# Patient Record
Sex: Female | Born: 1975 | Race: Black or African American | Hispanic: No | Smoking: Current every day smoker
Health system: Southern US, Community
[De-identification: ages and names within clinical notes are randomized; demographics above are authoritative.]

## PROBLEM LIST (undated history)

## (undated) DIAGNOSIS — L309 Dermatitis, unspecified: Secondary | ICD-10-CM

## (undated) DIAGNOSIS — S82852A Displaced trimalleolar fracture of left lower leg, initial encounter for closed fracture: Secondary | ICD-10-CM

## (undated) HISTORY — PX: TUBAL LIGATION: SHX77

---

## 2011-06-26 ENCOUNTER — Emergency Department (HOSPITAL_COMMUNITY): Payer: Medicaid Other

## 2011-06-26 ENCOUNTER — Emergency Department (HOSPITAL_COMMUNITY)
Admission: EM | Admit: 2011-06-26 | Discharge: 2011-06-26 | Disposition: A | Discharge status: Home / Self Care | Payer: Medicaid Other | Attending: Emergency Medicine | Admitting: Emergency Medicine

## 2011-06-26 DIAGNOSIS — O009 Unspecified ectopic pregnancy without intrauterine pregnancy: Secondary | ICD-10-CM | POA: Insufficient documentation

## 2011-06-26 DIAGNOSIS — O208 Other hemorrhage in early pregnancy: Secondary | ICD-10-CM | POA: Insufficient documentation

## 2011-06-26 DIAGNOSIS — R109 Unspecified abdominal pain: Secondary | ICD-10-CM | POA: Insufficient documentation

## 2011-06-26 LAB — POCT PREGNANCY, URINE: Preg Test, Ur: POSITIVE

## 2011-06-26 LAB — DIFFERENTIAL
Lymphocytes Relative: 32 % (ref 12–46)
Lymphs Abs: 2.2 10*3/uL (ref 0.7–4.0)
Neutrophils Relative %: 56 % (ref 43–77)

## 2011-06-26 LAB — URINALYSIS, ROUTINE W REFLEX MICROSCOPIC
Bilirubin Urine: NEGATIVE
Ketones, ur: NEGATIVE mg/dL
Nitrite: NEGATIVE
Specific Gravity, Urine: 1.026 (ref 1.005–1.030)
Urobilinogen, UA: 1 mg/dL (ref 0.0–1.0)

## 2011-06-26 LAB — BASIC METABOLIC PANEL
BUN: 12 mg/dL (ref 6–23)
CO2: 27 mEq/L (ref 19–32)
Chloride: 108 mEq/L (ref 96–112)
Creatinine, Ser: 1.02 mg/dL (ref 0.50–1.10)
Glucose, Bld: 82 mg/dL (ref 70–99)

## 2011-06-26 LAB — CBC
HCT: 34.3 % — ABNORMAL LOW (ref 36.0–46.0)
Hemoglobin: 11.7 g/dL — ABNORMAL LOW (ref 12.0–15.0)
MCV: 83.1 fL (ref 78.0–100.0)
RBC: 4.13 MIL/uL (ref 3.87–5.11)
WBC: 6.8 10*3/uL (ref 4.0–10.5)

## 2011-06-26 LAB — HCG, QUANTITATIVE, PREGNANCY: hCG, Beta Chain, Quant, S: 545 m[IU]/mL — ABNORMAL HIGH (ref <5)

## 2011-06-26 LAB — ABO/RH: ABO/RH(D): A POS

## 2011-06-27 ENCOUNTER — Other Ambulatory Visit: Payer: PRIVATE HEALTH INSURANCE

## 2011-06-27 DIAGNOSIS — O039 Complete or unspecified spontaneous abortion without complication: Secondary | ICD-10-CM

## 2011-06-28 ENCOUNTER — Encounter (HOSPITAL_COMMUNITY): Payer: Self-pay

## 2011-06-28 ENCOUNTER — Inpatient Hospital Stay (HOSPITAL_COMMUNITY)
Admission: AD | Admit: 2011-06-28 | Discharge: 2011-06-28 | Disposition: A | Discharge status: Home / Self Care | Payer: PRIVATE HEALTH INSURANCE | Source: Ambulatory Visit | Attending: Obstetrics & Gynecology | Admitting: Obstetrics & Gynecology

## 2011-06-28 DIAGNOSIS — O039 Complete or unspecified spontaneous abortion without complication: Secondary | ICD-10-CM | POA: Insufficient documentation

## 2011-06-28 NOTE — Progress Notes (Signed)
Patient still having bleeding as before with mild cramping here for BHCG

## 2011-06-28 NOTE — ED Provider Notes (Signed)
History     Chief Complaint  Patient presents with  . Vaginal Bleeding  . Abdominal Cramping   HPI Patient seen for follow up bhcg.  Bleeding has decreased to a light menses.  Denies abd pain, fevers, chills, nausea.  No past medical history on file.  No past surgical history on file.  No family history on file.  History  Substance Use Topics  . Smoking status: Not on file  . Smokeless tobacco: Not on file  . Alcohol Use: Not on file    Allergies: Allergies not on file  No prescriptions prior to admission    ROS Physical Exam   Blood pressure 121/78, pulse 70, temperature 98.6 F (37 C), temperature source Oral, resp. rate 16, last menstrual period 05/15/2011.  Physical Exam   BHCG 545 to 178. MAU Course  Procedures  MDM  Assessment and Plan  1.  Spontaneous abortion.  Will have pt follow up in 2 weeks to assure bhcg return to normal.  STINSON, JACOB JEHIEL 06/28/2011, 10:15 AM

## 2011-07-03 NOTE — Progress Notes (Signed)
Encounter addended by: Brooke Pace, DO on: 07/03/2011  1:37 PM<BR>     Documentation filed: Charges VN

## 2011-07-03 NOTE — Progress Notes (Signed)
Encounter addended by: Brooke Pace, DO on: 07/03/2011  1:35 PM<BR>     Documentation filed: Charges VN

## 2011-09-02 ENCOUNTER — Inpatient Hospital Stay (HOSPITAL_COMMUNITY): Payer: Medicaid Other

## 2011-09-02 ENCOUNTER — Inpatient Hospital Stay (HOSPITAL_COMMUNITY)
Admission: AD | Admit: 2011-09-02 | Discharge: 2011-09-02 | Disposition: A | Discharge status: Home Health | Payer: Medicaid Other | Source: Ambulatory Visit | Attending: Obstetrics & Gynecology | Admitting: Obstetrics & Gynecology

## 2011-09-02 ENCOUNTER — Encounter (HOSPITAL_COMMUNITY): Payer: Self-pay

## 2011-09-02 DIAGNOSIS — O99891 Other specified diseases and conditions complicating pregnancy: Secondary | ICD-10-CM | POA: Insufficient documentation

## 2011-09-02 DIAGNOSIS — R109 Unspecified abdominal pain: Secondary | ICD-10-CM

## 2011-09-02 DIAGNOSIS — O26899 Other specified pregnancy related conditions, unspecified trimester: Secondary | ICD-10-CM

## 2011-09-02 HISTORY — DX: Dermatitis, unspecified: L30.9

## 2011-09-02 LAB — URINALYSIS, ROUTINE W REFLEX MICROSCOPIC
Glucose, UA: NEGATIVE mg/dL
Hgb urine dipstick: NEGATIVE
Specific Gravity, Urine: 1.02 (ref 1.005–1.030)
Urobilinogen, UA: 0.2 mg/dL (ref 0.0–1.0)

## 2011-09-02 LAB — WET PREP, GENITAL
Clue Cells Wet Prep HPF POC: NONE SEEN
Trich, Wet Prep: NONE SEEN
Yeast Wet Prep HPF POC: NONE SEEN

## 2011-09-02 LAB — DIFFERENTIAL
Basophils Absolute: 0 10*3/uL (ref 0.0–0.1)
Lymphocytes Relative: 37 % (ref 12–46)
Monocytes Absolute: 0.3 10*3/uL (ref 0.1–1.0)
Neutro Abs: 2.2 10*3/uL (ref 1.7–7.7)

## 2011-09-02 LAB — POCT PREGNANCY, URINE: Preg Test, Ur: POSITIVE

## 2011-09-02 LAB — CBC
HCT: 36.2 % (ref 36.0–46.0)
RDW: 13.9 % (ref 11.5–15.5)
WBC: 4.5 10*3/uL (ref 4.0–10.5)

## 2011-09-02 NOTE — Progress Notes (Signed)
Patient states she had a positive home pregnancy test last week. Has had a SAB in September. Started having lower abdominal pain, no bleeding or vaginal discharge.

## 2011-09-02 NOTE — ED Provider Notes (Signed)
History     Chief Complaint  Patient presents with  . Abdominal Pain   HPI Shannon Vaughan is 35 y.o. [id number] [redacted]w[redacted]d weeks presenting with complaints of lower left abdominal pain in early pregnancy.  +UPT X 2 at home.  Not using contraception.  She is concerned that this is a miscarriage because she had one in September.  Denies vaginal bleeding.  Has " some " vaginal discharge without odor.  Has not decided where to get prenatal care.     Past Medical History  Diagnosis Date  . Asthma   . Eczema     Past Surgical History  Procedure Date  . Cesarean section     Family History  Problem Relation Age of Onset  . Hypertension Mother   . Diabetes Mother   . Hypertension Father   . Diabetes Father     History  Substance Use Topics  . Smoking status: Current Some Day Smoker  . Smokeless tobacco: Not on file  . Alcohol Use: Yes    Allergies:  Allergies  Allergen Reactions  . Penicillins Anaphylaxis    Prescriptions prior to admission  Medication Sig Dispense Refill  . albuterol (PROVENTIL HFA;VENTOLIN HFA) 108 (90 BASE) MCG/ACT inhaler Inhale 2 puffs into the lungs every 6 (six) hours as needed. Shortness of breath       . prenatal vitamin w/FE, FA (PRENATAL 1 + 1) 27-1 MG TABS Take 1 tablet by mouth daily.          Review of Systems  Constitutional: Negative.   HENT: Negative.   Respiratory: Negative.   Cardiovascular: Negative.   Gastrointestinal: Positive for abdominal pain (greater on the lower left.). Negative for nausea, vomiting, diarrhea and constipation.   Physical Exam   Blood pressure 113/73, pulse 79, temperature 98.8 F (37.1 C), temperature source Oral, resp. rate 16, height 5\' 6"  (1.676 m), weight 169 lb 3.2 oz (76.749 kg), last menstrual period 07/29/2011, SpO2 99.00%.  Physical Exam  Constitutional: She is oriented to person, place, and time. She appears well-developed and well-nourished. No distress.  HENT:  Head: Normocephalic.  Neck:  Normal range of motion.  Cardiovascular: Normal rate.   Respiratory: Effort normal.  GI: Soft. She exhibits no distension and no mass. There is no tenderness. There is no rebound and no guarding.  Genitourinary: Uterus is not enlarged and not tender. Cervix exhibits no motion tenderness, no discharge and no friability. Right adnexum displays no mass, no tenderness and no fullness. Left adnexum displays tenderness (mild on palpation). Left adnexum displays no mass and no fullness. No tenderness or bleeding around the vagina. No vaginal discharge found.  Neurological: She is alert and oriented to person, place, and time.  Skin: Skin is warm and dry.     Results for orders placed during the hospital encounter of 09/02/11 (from the past 24 hour(s))  URINALYSIS, ROUTINE W REFLEX MICROSCOPIC     Status: Normal   Collection Time   09/02/11  8:14 AM      Component Value Range   Color, Urine YELLOW  YELLOW    Appearance CLEAR  CLEAR    Specific Gravity, Urine 1.020  1.005 - 1.030    pH 7.0  5.0 - 8.0    Glucose, UA NEGATIVE  NEGATIVE (mg/dL)   Hgb urine dipstick NEGATIVE  NEGATIVE    Bilirubin Urine NEGATIVE  NEGATIVE    Ketones, ur NEGATIVE  NEGATIVE (mg/dL)   Protein, ur NEGATIVE  NEGATIVE (mg/dL)  Urobilinogen, UA 0.2  0.0 - 1.0 (mg/dL)   Nitrite NEGATIVE  NEGATIVE    Leukocytes, UA NEGATIVE  NEGATIVE   POCT PREGNANCY, URINE     Status: Normal   Collection Time   09/02/11  8:41 AM      Component Value Range   Preg Test, Ur POSITIVE    CBC     Status: Abnormal   Collection Time   09/02/11  8:55 AM      Component Value Range   WBC 4.5  4.0 - 10.5 (K/uL)   RBC 4.23  3.87 - 5.11 (MIL/uL)   Hemoglobin 11.9 (*) 12.0 - 15.0 (g/dL)   HCT 16.1  09.6 - 04.5 (%)   MCV 85.6  78.0 - 100.0 (fL)   MCH 28.1  26.0 - 34.0 (pg)   MCHC 32.9  30.0 - 36.0 (g/dL)   RDW 40.9  81.1 - 91.4 (%)   Platelets 213  150 - 400 (K/uL)  DIFFERENTIAL     Status: Normal   Collection Time   09/02/11  8:55 AM       Component Value Range   Neutrophils Relative 50  43 - 77 (%)   Neutro Abs 2.2  1.7 - 7.7 (K/uL)   Lymphocytes Relative 37  12 - 46 (%)   Lymphs Abs 1.6  0.7 - 4.0 (K/uL)   Monocytes Relative 8  3 - 12 (%)   Monocytes Absolute 0.3  0.1 - 1.0 (K/uL)   Eosinophils Relative 5  0 - 5 (%)   Eosinophils Absolute 0.2  0.0 - 0.7 (K/uL)   Basophils Relative 1  0 - 1 (%)   Basophils Absolute 0.0  0.0 - 0.1 (K/uL)  HCG, QUANTITATIVE, PREGNANCY     Status: Abnormal   Collection Time   09/02/11  8:55 AM      Component Value Range   hCG, Beta Chain, Quant, S 6289 (*) <5 (mIU/mL)  WET PREP, GENITAL     Status: Abnormal   Collection Time   09/02/11  9:09 AM      Component Value Range   Yeast, Wet Prep NONE SEEN  NONE SEEN    Trich, Wet Prep NONE SEEN  NONE SEEN    Clue Cells, Wet Prep NONE SEEN  NONE SEEN    WBC, Wet Prep HPF POC FEW (*) NONE SEEN    ABO RH from previous record is A POSTIVE  ULTRSOUND REPORT:  [redacted]w[redacted]d IUGS with YS  Trace FF, No abnormality visualized.  SM CLC on right OV MAU Course  Procedures  GC/CHL culture to lab   Assessment and Plan  A:  Intrauterine Pregnancy at [redacted]w[redacted]d  P  Begin prenatal care with doctor of your choice.  Begin prenatal vitamins.   KEY,EVE M 09/02/2011, 9:15 AM   Matt Holmes, NP 09/02/11 1115

## 2011-11-30 ENCOUNTER — Other Ambulatory Visit: Payer: Self-pay

## 2011-11-30 ENCOUNTER — Emergency Department (HOSPITAL_COMMUNITY)
Admission: EM | Admit: 2011-11-30 | Discharge: 2011-11-30 | Disposition: A | Discharge status: Home / Self Care | Payer: Medicaid Other | Attending: Emergency Medicine | Admitting: Emergency Medicine

## 2011-11-30 ENCOUNTER — Encounter (HOSPITAL_COMMUNITY): Payer: Self-pay

## 2011-11-30 DIAGNOSIS — M545 Low back pain, unspecified: Secondary | ICD-10-CM | POA: Insufficient documentation

## 2011-11-30 DIAGNOSIS — F17200 Nicotine dependence, unspecified, uncomplicated: Secondary | ICD-10-CM | POA: Insufficient documentation

## 2011-11-30 DIAGNOSIS — J45909 Unspecified asthma, uncomplicated: Secondary | ICD-10-CM | POA: Insufficient documentation

## 2011-11-30 DIAGNOSIS — G8929 Other chronic pain: Secondary | ICD-10-CM | POA: Insufficient documentation

## 2011-11-30 DIAGNOSIS — R55 Syncope and collapse: Secondary | ICD-10-CM | POA: Insufficient documentation

## 2011-11-30 LAB — URINALYSIS, ROUTINE W REFLEX MICROSCOPIC
Bilirubin Urine: NEGATIVE
Hgb urine dipstick: NEGATIVE
Ketones, ur: NEGATIVE mg/dL
Nitrite: NEGATIVE
Urobilinogen, UA: 0.2 mg/dL (ref 0.0–1.0)

## 2011-11-30 LAB — CBC
HCT: 29.9 % — ABNORMAL LOW (ref 36.0–46.0)
MCH: 28.7 pg (ref 26.0–34.0)
MCV: 84.2 fL (ref 78.0–100.0)
Platelets: 181 10*3/uL (ref 150–400)
RDW: 14.5 % (ref 11.5–15.5)
WBC: 5.4 10*3/uL (ref 4.0–10.5)

## 2011-11-30 LAB — BASIC METABOLIC PANEL
CO2: 23 mEq/L (ref 19–32)
Calcium: 9.3 mg/dL (ref 8.4–10.5)
Creatinine, Ser: 0.74 mg/dL (ref 0.50–1.10)
GFR calc non Af Amer: >90 mL/min (ref >90)
Glucose, Bld: 84 mg/dL (ref 70–99)
Sodium: 137 mEq/L (ref 135–145)

## 2011-11-30 LAB — CARDIAC PANEL(CRET KIN+CKTOT+MB+TROPI)
CK, MB: 1.8 ng/mL (ref 0.3–4.0)
Troponin I: <0.3 ng/mL (ref <0.30)

## 2011-11-30 LAB — DIFFERENTIAL
Basophils Absolute: 0 10*3/uL (ref 0.0–0.1)
Eosinophils Absolute: 0.1 10*3/uL (ref 0.0–0.7)
Eosinophils Relative: 2 % (ref 0–5)
Lymphocytes Relative: 22 % (ref 12–46)
Monocytes Absolute: 0.4 10*3/uL (ref 0.1–1.0)

## 2011-11-30 LAB — URINE MICROSCOPIC-ADD ON

## 2011-11-30 MED ORDER — SODIUM CHLORIDE 0.9 % IV BOLUS (SEPSIS)
1000.0000 mL | Freq: Once | INTRAVENOUS | Status: AC
  Administered 2011-11-30: 1000 mL via INTRAVENOUS

## 2011-11-30 NOTE — Discharge Instructions (Signed)
Follow up with your OB-GYN. Drink plenty of fluids and change positions slowly. Return for worsening symptoms or any concerns    Syncope You have had a fainting (syncopal) spell. A fainting episode is a sudden, short-lived loss of consciousness. It results in complete recovery. It occurs because there has been a temporary shortage of oxygen and/or sugar (glucose) to the brain. CAUSES   Blood pressure pills and other medications that may lower blood pressure below normal. Sudden changes in posture (sudden standing).   Over-medication. Take your medications as directed.   Standing too long. This can cause blood to pool in the legs.   Seizure disorders.   Low blood sugar (hypoglycemia) of diabetes. This more commonly causes coma.   Bearing down to go to the bathroom. This can cause your blood pressure to rise suddenly. Your body compensates by making the blood pressure too low when you stop bearing down.   Hardening of the arteries where the brain temporarily does not receive enough blood.   Irregular heart beat and circulatory problems.   Fear, emotional distress, injury, sight of blood, or illness.  Your caregiver will send you home if the syncope was from non-worrisome causes (benign). Depending on your age and health, you may stay to be monitored and observed. If you return home, have someone stay with you if your caregiver feels that is desirable. It is very important to keep all follow-up referrals and appointments in order to properly manage this condition. This is a serious problem which can lead to serious illness and death if not carefully managed.  WARNING: Do not drive or operate machinery until your caregiver feels that it is safe for you to do so. SEEK IMMEDIATE MEDICAL CARE IF:   You have another fainting episode or faint while lying or sitting down. DO NOT DRIVE YOURSELF. Call 911 if no other help is available.   You have chest pain, are feeling sick to your stomach  (nausea), vomiting or abdominal pain.   You have an irregular heartbeat or one that is very fast (pulse over 120 beats per minute).   You have a loss of feeling in some part of your body or lose movement in your arms or legs.   You have difficulty with speech, confusion, severe weakness, or visual problems.   You become sweaty and/or feel light headed.  Make sure you are rechecked as instructed. Document Released: 09/30/2005 Document Revised: 06/12/2011 Document Reviewed: 05/21/2007 [facility] Patient Information 2012 [place], [place].

## 2011-11-30 NOTE — ED Provider Notes (Signed)
History     CSN: [id number]  Arrival date & time 11/30/11  0848   First MD Initiated Contact with Patient 11/30/11 0900      Chief Complaint  Patient presents with  . Loss of Consciousness    (Consider location/radiation/quality/duration/timing/severity/associated sxs/prior treatment) The history is provided by the patient.   Pt with syncopal episode at work. Fell from standing striking counter. No previous episodes. Pt states she became lightheaded before event. Pt denies HA, neck pain, N/V, abd pain, vag bleeding. Has [place] confirmed IUP at 18 weeks Past Medical History  Diagnosis Date  . Asthma   . Eczema     Past Surgical History  Procedure Date  . Cesarean section     Family History  Problem Relation Age of Onset  . Hypertension Mother   . Diabetes Mother   . Hypertension Father   . Diabetes Father     History  Substance Use Topics  . Smoking status: Current Some Day Smoker  . Smokeless tobacco: Not on file  . Alcohol Use: Yes    OB History    Grav Para Term Preterm Abortions TAB SAB Ect Mult Living   5 2 2  2 1 1   2       Review of Systems  Constitutional: Negative for fever and chills.  HENT: Negative for neck pain and neck stiffness.   Eyes: Negative for visual disturbance.  Respiratory: Negative for shortness of breath.   Cardiovascular: Negative for chest pain and leg swelling.  Gastrointestinal: Negative for nausea, vomiting and abdominal pain.  Genitourinary: Negative for vaginal bleeding, vaginal discharge, vaginal pain and pelvic pain.  Musculoskeletal: Back pain: Pt states there has been no change to her chronic low back pain.   Skin: Negative for color change, pallor, rash and wound.  Neurological: Positive for syncope and light-headedness. Negative for seizures, weakness, numbness and headaches.    Allergies  Penicillins  Home Medications   Current Outpatient Rx  Name Route Sig Dispense Refill  . ALBUTEROL SULFATE HFA 108 (90 BASE)  MCG/ACT IN AERS Inhalation Inhale 2 puffs into the lungs every 6 (six) hours as needed. Shortness of breath     . PRENATAL PLUS 27-1 MG PO TABS Oral Take 1 tablet by mouth daily.        BP 103/59  Pulse 88  Temp 97.8 F (36.6 C)  Resp 16  SpO2 100%  LMP 07/29/2011  Physical Exam  Nursing note and vitals reviewed. Constitutional: She is oriented to person, place, and time. She appears well-developed and well-nourished. No distress.  HENT:  Head: Normocephalic and atraumatic.  Mouth/Throat: Oropharynx is clear and moist.  Eyes: EOM are normal. Pupils are equal, round, and reactive to light.  Neck: Normal range of motion. Neck supple.       No cervical mid-line ttp. FROM. Cspine cleared by NEXUS criteria.   Cardiovascular: Normal rate and regular rhythm.   Pulmonary/Chest: Effort normal and breath sounds normal. No respiratory distress. She has no wheezes. She has no rales.  Abdominal: Soft. Bowel sounds are normal. She exhibits no distension. There is no tenderness. There is no rebound and no guarding.  Musculoskeletal: Normal range of motion. She exhibits no edema and no tenderness.       Very mild low lumber tenderness. No deformity  Neurological: She is alert and oriented to person, place, and time.       5/5 motor, sensation intact.   Skin: Skin is warm and dry. No  rash noted. No erythema.  Psychiatric: She has a normal mood and affect. Her behavior is normal.    ED Course  Procedures (including critical care time)  Labs Reviewed  CBC - Abnormal; Notable for the following:    RBC 3.55 (*)    Hemoglobin 10.2 (*)    HCT 29.9 (*)    All other components within normal limits  URINALYSIS, ROUTINE W REFLEX MICROSCOPIC - Abnormal; Notable for the following:    APPearance TURBID (*)    Leukocytes, UA SMALL (*)    All other components within normal limits  URINE MICROSCOPIC-ADD ON - Abnormal; Notable for the following:    Squamous Epithelial / LPF MANY (*)    Bacteria, UA MANY  (*)    All other components within normal limits  DIFFERENTIAL  BASIC METABOLIC PANEL  CARDIAC PANEL(CRET KIN+CKTOT+MB+TROPI)  URINE CULTURE   No results found.   1. Syncope      Date: 11/30/2011  Rate: 78  Rhythm: normal sinus rhythm  QRS Axis: normal  Intervals: normal  ST/T Wave abnormalities: nonspecific ST changes  Conduction Disutrbances:none  Narrative Interpretation:   Old EKG Reviewed: none available    MDM    Pt back to her baseline. Pt advised to stay well hydrated and change positions slowly. Return for any concerns        Loren Racer, MD 11/30/11 1300

## 2011-11-30 NOTE — ED Notes (Signed)
MD at bedside. EDP [place] 

## 2011-11-30 NOTE — ED Notes (Signed)
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

## 2011-11-30 NOTE — ED Notes (Signed)
Pt reports she was walking at work, felt dizzy and passed out onto counter top, pt reports when she came to she was lying flat on the floor, pt unsure if she hit her head, denies head pain, reports chronic low back pain, reports she is [redacted] wks pregnant, denies any abd pain or cramping, vaginal bleeding or d/c

## 2011-12-01 LAB — URINE CULTURE

## 2012-04-10 ENCOUNTER — Encounter (HOSPITAL_COMMUNITY): Payer: Self-pay | Admitting: Anesthesiology

## 2012-04-10 ENCOUNTER — Encounter (HOSPITAL_COMMUNITY)
Admission: AD | Disposition: A | Discharge status: Home / Self Care | Payer: Self-pay | Source: Ambulatory Visit | Attending: Obstetrics and Gynecology

## 2012-04-10 ENCOUNTER — Inpatient Hospital Stay (HOSPITAL_COMMUNITY): Payer: Medicaid Other | Admitting: Anesthesiology

## 2012-04-10 ENCOUNTER — Encounter (HOSPITAL_COMMUNITY): Payer: Self-pay | Admitting: *Deleted

## 2012-04-10 ENCOUNTER — Inpatient Hospital Stay (HOSPITAL_COMMUNITY)
Admission: AD | Admit: 2012-04-10 | Discharge: 2012-04-13 | DRG: 765 | Disposition: A | Discharge status: Home / Self Care | Payer: Medicaid Other | Source: Ambulatory Visit | Attending: Obstetrics and Gynecology | Admitting: Obstetrics and Gynecology

## 2012-04-10 DIAGNOSIS — O09529 Supervision of elderly multigravida, unspecified trimester: Secondary | ICD-10-CM | POA: Diagnosis present

## 2012-04-10 DIAGNOSIS — Z302 Encounter for sterilization: Secondary | ICD-10-CM

## 2012-04-10 DIAGNOSIS — O42919 Preterm premature rupture of membranes, unspecified as to length of time between rupture and onset of labor, unspecified trimester: Secondary | ICD-10-CM | POA: Diagnosis present

## 2012-04-10 DIAGNOSIS — O4290 Premature rupture of membranes, unspecified as to length of time between rupture and onset of labor, unspecified weeks of gestation: Secondary | ICD-10-CM | POA: Diagnosis present

## 2012-04-10 DIAGNOSIS — O34219 Maternal care for unspecified type scar from previous cesarean delivery: Principal | ICD-10-CM | POA: Diagnosis present

## 2012-04-10 LAB — CBC
HCT: 32.7 % — ABNORMAL LOW (ref 36.0–46.0)
Hemoglobin: 11 g/dL — ABNORMAL LOW (ref 12.0–15.0)
MCHC: 33.6 g/dL (ref 30.0–36.0)
RBC: 3.8 MIL/uL — ABNORMAL LOW (ref 3.87–5.11)

## 2012-04-10 SURGERY — Surgical Case
Anesthesia: Spinal | Site: Abdomen | Wound class: Clean Contaminated

## 2012-04-10 MED ORDER — LACTATED RINGERS IV SOLN
INTRAVENOUS | Status: DC
  Administered 2012-04-10 (×2): via INTRAVENOUS

## 2012-04-10 MED ORDER — PHENYLEPHRINE HCL 10 MG/ML IJ SOLN
INTRAMUSCULAR | Status: DC | PRN
  Administered 2012-04-10: 80 ug via INTRAVENOUS

## 2012-04-10 MED ORDER — MORPHINE SULFATE 0.5 MG/ML IJ SOLN
INTRAMUSCULAR | Status: AC
  Filled 2012-04-10: qty 10

## 2012-04-10 MED ORDER — FAMOTIDINE IN NACL 20-0.9 MG/50ML-% IV SOLN
20.0000 mg | Freq: Once | INTRAVENOUS | Status: AC
  Administered 2012-04-10: 20 mg via INTRAVENOUS
  Filled 2012-04-10: qty 50

## 2012-04-10 MED ORDER — FENTANYL CITRATE 0.05 MG/ML IJ SOLN
INTRAMUSCULAR | Status: AC
  Filled 2012-04-10: qty 2

## 2012-04-10 MED ORDER — CITRIC ACID-SODIUM CITRATE 334-500 MG/5ML PO SOLN
30.0000 mL | Freq: Once | ORAL | Status: AC
  Administered 2012-04-10: 30 mL via ORAL
  Filled 2012-04-10: qty 15

## 2012-04-10 MED ORDER — GENTAMICIN SULFATE 40 MG/ML IJ SOLN
INTRAVENOUS | Status: AC
  Administered 2012-04-10: 100 mL via INTRAVENOUS
  Filled 2012-04-10: qty 2.5

## 2012-04-10 SURGICAL SUPPLY — 28 items
CHLORAPREP W/TINT 26ML (MISCELLANEOUS) ×3 IMPLANT
CLOTH BEACON ORANGE TIMEOUT ST (SAFETY) ×3 IMPLANT
CONTAINER PREFILL 10% NBF 15ML (MISCELLANEOUS) IMPLANT
DRSG COVADERM 4X10 (GAUZE/BANDAGES/DRESSINGS) ×3 IMPLANT
ELECT REM PT RETURN 9FT ADLT (ELECTROSURGICAL) ×3
ELECTRODE REM PT RTRN 9FT ADLT (ELECTROSURGICAL) ×2 IMPLANT
EXTRACTOR VACUUM KIWI (MISCELLANEOUS) IMPLANT
EXTRACTOR VACUUM M CUP 4 TUBE (SUCTIONS) IMPLANT
GLOVE BIO SURGEON STRL SZ8 (GLOVE) ×3 IMPLANT
GLOVE ORTHO TXT STRL SZ7.5 (GLOVE) ×3 IMPLANT
GOWN PREVENTION PLUS LG XLONG (DISPOSABLE) ×6 IMPLANT
KIT ABG SYR 3ML LUER SLIP (SYRINGE) ×3 IMPLANT
NEEDLE HYPO 25X5/8 SAFETYGLIDE (NEEDLE) ×3 IMPLANT
NS IRRIG 1000ML POUR BTL (IV SOLUTION) ×3 IMPLANT
PACK C SECTION WH (CUSTOM PROCEDURE TRAY) ×3 IMPLANT
RTRCTR C-SECT PINK 25CM LRG (MISCELLANEOUS) ×3 IMPLANT
SLEEVE SCD COMPRESS KNEE MED (MISCELLANEOUS) ×3 IMPLANT
STAPLER VISISTAT 35W (STAPLE) ×3 IMPLANT
SUT CHROMIC 1 CTX 36 (SUTURE) ×6 IMPLANT
SUT PLAIN 0 NONE (SUTURE) ×3 IMPLANT
SUT PLAIN 2 0 XLH (SUTURE) ×3 IMPLANT
SUT VIC AB 0 CT1 27 (SUTURE) ×2
SUT VIC AB 0 CT1 27XBRD ANBCTR (SUTURE) ×4 IMPLANT
SUT VIC AB 4-0 KS 27 (SUTURE) IMPLANT
TAPE CLOTH SURG 4X10 WHT LF (GAUZE/BANDAGES/DRESSINGS) ×3 IMPLANT
TOWEL OR 17X24 6PK STRL BLUE (TOWEL DISPOSABLE) ×6 IMPLANT
TRAY FOLEY CATH 14FR (SET/KITS/TRAYS/PACK) ×3 IMPLANT
WATER STERILE IRR 1000ML POUR (IV SOLUTION) ×3 IMPLANT

## 2012-04-10 NOTE — Progress Notes (Signed)
Pt turned to R side and not L

## 2012-04-10 NOTE — H&P (Signed)
Shannon Vaughan is a 36 y.o. female, EGA 36+ weeks, presenting for evaluation of leaking fluid.  Started leaking fluid at about 1645 today.  Eval in MAU confirms ROM, cervix not significantly dilated, irregular ctx.  Previous c-section x 2, scheduled for repeat c-section and BTL on 7-16.  Prenatal care otherwise uncomplicated, see prenatal records for complete history.  Maternal Medical History:  Reason for admission: Reason for admission: rupture of membranes.  Contractions: Frequency: irregular.   Perceived severity is mild.    Fetal activity: Perceived fetal activity is normal.    Prenatal complications: Previous c-section x 2    OB History    Grav Para Term Preterm Abortions TAB SAB Ect Mult Living   5 2 2  2 1 1   2     c-section at term x 2, EAB x 1, SAB x 2  Past Medical History  Diagnosis Date  . Asthma   . Eczema    Past Surgical History  Procedure Date  . Cesarean section    Family History: family history includes Diabetes in her father and mother and Hypertension in her father and mother. Social History:  reports that she has been smoking.  She does not have any smokeless tobacco history on file. She reports that she drinks alcohol. She reports that she does not use illicit drugs.   Prenatal Transfer Tool  Maternal Diabetes: No Genetic Screening: Normal Maternal Ultrasounds/Referrals: Normal Fetal Ultrasounds or other Referrals:  None Maternal Substance Abuse:  No Significant Maternal Medications:  None Significant Maternal Lab Results:  Lab values include: Group B Strep negative Other Comments:  PROM, previous c-section x 2  Review of Systems  Respiratory: Negative.   Cardiovascular: Negative.     Dilation: Fingertip Effacement (%): 50 Station:  (high) Exam by:: Jolyn, RN Blood pressure 124/71, pulse 101, temperature 98.3 F (36.8 C), temperature source Oral, resp. rate 20, height 5\' 6"  (1.676 m), last menstrual period 07/29/2011, SpO2 98.00%. Maternal  Exam:  Uterine Assessment: Contraction strength is mild.  Contraction frequency is irregular.   Abdomen: Patient reports no abdominal tenderness. Surgical scars: low transverse.   Estimated fetal weight is 7 lbs.   Fetal presentation: vertex  Introitus: Normal vulva. Normal vagina.  Ferning test: positive.  Amniotic fluid character: clear.     Fetal Exam Fetal Monitor Review: Mode: ultrasound.   Baseline rate: 140.  Variability: moderate (6-25 bpm).   Pattern: accelerations present, variable decelerations and late decelerations.    Fetal State Assessment: Category II - tracings are indeterminate.     Physical Exam  Constitutional: She appears well-developed and well-nourished.  Cardiovascular: Normal rate, regular rhythm and normal heart sounds.   No murmur heard. Respiratory: Effort normal and breath sounds normal. No respiratory distress. She has no wheezes.  GI: Soft.       gravid    Prenatal labs: ABO, Rh: --/--/A POS (09/12 1554) Antibody:  neg Rubella:  Imm RPR:  NR  HBsAg:   neg HIV:   NR GBS:   neg GCT 103  Assessment/Plan: IUP at 36+ weeks, previous c-section x 2, with PPROM.  Wants BTL as well.  Ate sandwich 4 hrs ago, so will try to wait for 4 more hours and then do repeat c-section with BTL.  Procedure and risks discussed.  Will proceed earlier if non-reassuring FHT.   Sameer Teeple D 04/10/2012, 7:36 PM

## 2012-04-10 NOTE — Progress Notes (Signed)
Pt resting on R side.

## 2012-04-10 NOTE — Anesthesia Preprocedure Evaluation (Signed)
Anesthesia Evaluation  Patient identified by MRN, date of birth, ID band Patient awake    Reviewed: Allergy & Precautions, H&P , Patient's Chart, lab work & pertinent test results  Airway Mallampati: II TM Distance: >3 FB Neck ROM: full    Dental No notable dental hx.    Pulmonary asthma (chest clear, no inhaler use . No recent URI's) ,  breath sounds clear to auscultation  Pulmonary exam normal       Cardiovascular Exercise Tolerance: Good Rhythm:regular Rate:Normal     Neuro/Psych    GI/Hepatic   Endo/Other    Renal/GU      Musculoskeletal   Abdominal   Peds  Hematology   Anesthesia Other Findings   Reproductive/Obstetrics                           Anesthesia Physical Anesthesia Plan  ASA: II  Anesthesia Plan: Spinal   Post-op Pain Management:    Induction:   Airway Management Planned:   Additional Equipment:   Intra-op Plan:   Post-operative Plan:   Informed Consent: I have reviewed the patients History and Physical, chart, labs and discussed the procedure including the risks, benefits and alternatives for the proposed anesthesia with the patient or authorized representative who has indicated his/her understanding and acceptance.   Dental Advisory Given  Plan Discussed with: CRNA  Anesthesia Plan Comments: (Lab work confirmed with CRNA in room. Platelets okay. Discussed spinal anesthetic, and patient consents to the procedure:  included risk of possible headache,backache, failed block, allergic reaction, and nerve injury. This patient was asked if she had any questions or concerns before the procedure started. )        Anesthesia Quick Evaluation

## 2012-04-10 NOTE — MAU Note (Signed)
Dr Cristela Blue, CN, and [facility] notified of pt's admission and plan for repeat C/S at 2330.

## 2012-04-10 NOTE — Progress Notes (Signed)
FHR 140-145 after spinal anesthesia

## 2012-04-10 NOTE — Progress Notes (Signed)
Pt had turned back to semifowlers earlier and now tilted to L side.

## 2012-04-10 NOTE — Anesthesia Procedure Notes (Signed)

## 2012-04-10 NOTE — Progress Notes (Signed)
Pt up to Br 

## 2012-04-10 NOTE — MAU Note (Signed)
Patient states she started leaking clear fluid at 1645 and continues to leak a large amount of clear fluid on arrival to MAU. Patient denies any contractions. Patient is scheduled for a repeat cesarean section on 7-16.

## 2012-04-11 ENCOUNTER — Encounter (HOSPITAL_COMMUNITY): Payer: Self-pay

## 2012-04-11 LAB — RPR: RPR Ser Ql: NONREACTIVE

## 2012-04-11 MED ORDER — ONDANSETRON HCL 4 MG/2ML IJ SOLN: 4.0000 mg | Freq: Three times a day (TID) | INTRAMUSCULAR | Status: DC | PRN

## 2012-04-11 MED ORDER — OXYTOCIN 10 UNIT/ML IJ SOLN
INTRAMUSCULAR | Status: AC
  Filled 2012-04-11: qty 4

## 2012-04-11 MED ORDER — OXYTOCIN 40 UNITS IN LACTATED RINGERS INFUSION - SIMPLE MED: 62.5000 mL/h | INTRAVENOUS | Status: AC

## 2012-04-11 MED ORDER — WITCH HAZEL-GLYCERIN EX PADS: 1.0000 "application " | MEDICATED_PAD | CUTANEOUS | Status: DC | PRN

## 2012-04-11 MED ORDER — ONDANSETRON HCL 4 MG/2ML IJ SOLN
INTRAMUSCULAR | Status: DC | PRN
  Administered 2012-04-11: 4 mg via INTRAVENOUS

## 2012-04-11 MED ORDER — ONDANSETRON HCL 4 MG/2ML IJ SOLN
INTRAMUSCULAR | Status: AC
  Filled 2012-04-11: qty 2

## 2012-04-11 MED ORDER — MAGNESIUM HYDROXIDE 400 MG/5ML PO SUSP: 30.0000 mL | ORAL | Status: DC | PRN

## 2012-04-11 MED ORDER — SIMETHICONE 80 MG PO CHEW: 80.0000 mg | CHEWABLE_TABLET | ORAL | Status: DC | PRN

## 2012-04-11 MED ORDER — DIBUCAINE 1 % RE OINT: 1.0000 "application " | TOPICAL_OINTMENT | RECTAL | Status: DC | PRN

## 2012-04-11 MED ORDER — NALBUPHINE SYRINGE 5 MG/0.5 ML
INJECTION | INTRAMUSCULAR | Status: AC
  Administered 2012-04-11: 10 mg via SUBCUTANEOUS
  Filled 2012-04-11: qty 1

## 2012-04-11 MED ORDER — IBUPROFEN 600 MG PO TABS: 600.0000 mg | ORAL_TABLET | Freq: Four times a day (QID) | ORAL | Status: DC | PRN

## 2012-04-11 MED ORDER — NALBUPHINE SYRINGE 5 MG/0.5 ML
5.0000 mg | INJECTION | INTRAMUSCULAR | Status: DC | PRN
  Administered 2012-04-11 (×3): 10 mg via SUBCUTANEOUS
  Administered 2012-04-12: 5 mg via SUBCUTANEOUS
  Filled 2012-04-11: qty 1
  Filled 2012-04-11: qty 0.5

## 2012-04-11 MED ORDER — ONDANSETRON HCL 4 MG/2ML IJ SOLN: 4.0000 mg | INTRAMUSCULAR | Status: DC | PRN

## 2012-04-11 MED ORDER — NALBUPHINE SYRINGE 5 MG/0.5 ML
5.0000 mg | INJECTION | INTRAMUSCULAR | Status: DC | PRN
  Filled 2012-04-11 (×4): qty 1

## 2012-04-11 MED ORDER — TETANUS-DIPHTH-ACELL PERTUSSIS 5-2.5-18.5 LF-MCG/0.5 IM SUSP: 0.5000 mL | Freq: Once | INTRAMUSCULAR | Status: DC

## 2012-04-11 MED ORDER — PRENATAL MULTIVITAMIN CH
1.0000 | ORAL_TABLET | Freq: Every day | ORAL | Status: DC
  Administered 2012-04-11 – 2012-04-13 (×2): 1 via ORAL
  Filled 2012-04-11 (×2): qty 1

## 2012-04-11 MED ORDER — PHENYLEPHRINE 40 MCG/ML (10ML) SYRINGE FOR IV PUSH (FOR BLOOD PRESSURE SUPPORT)
PREFILLED_SYRINGE | INTRAVENOUS | Status: AC
  Filled 2012-04-11: qty 5

## 2012-04-11 MED ORDER — NALOXONE HCL 0.4 MG/ML IJ SOLN: 0.4000 mg | INTRAMUSCULAR | Status: DC | PRN

## 2012-04-11 MED ORDER — FENTANYL CITRATE 0.05 MG/ML IJ SOLN
INTRAMUSCULAR | Status: DC | PRN
  Administered 2012-04-10: 25 ug via INTRATHECAL
  Administered 2012-04-11: 75 ug via INTRAVENOUS

## 2012-04-11 MED ORDER — KETOROLAC TROMETHAMINE 60 MG/2ML IM SOLN
60.0000 mg | Freq: Once | INTRAMUSCULAR | Status: AC | PRN
  Administered 2012-04-11: 60 mg via INTRAMUSCULAR

## 2012-04-11 MED ORDER — DIPHENHYDRAMINE HCL 50 MG/ML IJ SOLN: 25.0000 mg | INTRAMUSCULAR | Status: DC | PRN

## 2012-04-11 MED ORDER — HYDROMORPHONE HCL PF 1 MG/ML IJ SOLN: 0.2500 mg | INTRAMUSCULAR | Status: DC | PRN

## 2012-04-11 MED ORDER — OXYCODONE-ACETAMINOPHEN 5-325 MG PO TABS
1.0000 | ORAL_TABLET | ORAL | Status: DC | PRN
  Administered 2012-04-11: 1 via ORAL
  Administered 2012-04-11: 2 via ORAL
  Administered 2012-04-12: 1 via ORAL
  Administered 2012-04-12: 2 via ORAL
  Administered 2012-04-13: 1 via ORAL
  Administered 2012-04-13: 2 via ORAL
  Filled 2012-04-11: qty 1
  Filled 2012-04-11 (×4): qty 2
  Filled 2012-04-11 (×2): qty 1

## 2012-04-11 MED ORDER — KETOROLAC TROMETHAMINE 30 MG/ML IJ SOLN: 30.0000 mg | Freq: Four times a day (QID) | INTRAMUSCULAR | Status: AC | PRN

## 2012-04-11 MED ORDER — MEPERIDINE HCL 25 MG/ML IJ SOLN: 6.2500 mg | INTRAMUSCULAR | Status: DC | PRN

## 2012-04-11 MED ORDER — SENNOSIDES-DOCUSATE SODIUM 8.6-50 MG PO TABS
2.0000 | ORAL_TABLET | Freq: Every day | ORAL | Status: DC
  Administered 2012-04-11 – 2012-04-12 (×2): 2 via ORAL

## 2012-04-11 MED ORDER — ZOLPIDEM TARTRATE 5 MG PO TABS: 5.0000 mg | ORAL_TABLET | Freq: Every evening | ORAL | Status: DC | PRN

## 2012-04-11 MED ORDER — MEASLES, MUMPS & RUBELLA VAC ~~LOC~~ INJ
0.5000 mL | INJECTION | Freq: Once | SUBCUTANEOUS | Status: DC
  Filled 2012-04-11: qty 0.5

## 2012-04-11 MED ORDER — SODIUM CHLORIDE 0.9 % IV SOLN
1.0000 ug/kg/h | INTRAVENOUS | Status: DC | PRN
  Filled 2012-04-11: qty 2.5

## 2012-04-11 MED ORDER — ONDANSETRON HCL 4 MG PO TABS: 4.0000 mg | ORAL_TABLET | ORAL | Status: DC | PRN

## 2012-04-11 MED ORDER — DIPHENHYDRAMINE HCL 25 MG PO CAPS
25.0000 mg | ORAL_CAPSULE | ORAL | Status: DC | PRN
  Administered 2012-04-11: 25 mg via ORAL
  Filled 2012-04-11: qty 1

## 2012-04-11 MED ORDER — ALBUTEROL SULFATE HFA 108 (90 BASE) MCG/ACT IN AERS
2.0000 | INHALATION_SPRAY | Freq: Four times a day (QID) | RESPIRATORY_TRACT | Status: DC | PRN
  Filled 2012-04-11: qty 6.7

## 2012-04-11 MED ORDER — KETOROLAC TROMETHAMINE 30 MG/ML IJ SOLN
30.0000 mg | Freq: Four times a day (QID) | INTRAMUSCULAR | Status: AC | PRN
  Filled 2012-04-11: qty 1

## 2012-04-11 MED ORDER — MENTHOL 3 MG MT LOZG: 1.0000 | LOZENGE | OROMUCOSAL | Status: DC | PRN

## 2012-04-11 MED ORDER — DIPHENHYDRAMINE HCL 50 MG/ML IJ SOLN: 12.5000 mg | INTRAMUSCULAR | Status: DC | PRN

## 2012-04-11 MED ORDER — LACTATED RINGERS IV SOLN
INTRAVENOUS | Status: DC
  Administered 2012-04-11: 13:00:00 via INTRAVENOUS

## 2012-04-11 MED ORDER — MORPHINE SULFATE (PF) 0.5 MG/ML IJ SOLN
INTRAMUSCULAR | Status: DC | PRN
  Administered 2012-04-10: .15 mg via INTRATHECAL

## 2012-04-11 MED ORDER — DIPHENHYDRAMINE HCL 25 MG PO CAPS: 25.0000 mg | ORAL_CAPSULE | Freq: Four times a day (QID) | ORAL | Status: DC | PRN

## 2012-04-11 MED ORDER — SCOPOLAMINE 1 MG/3DAYS TD PT72: 1.0000 | MEDICATED_PATCH | Freq: Once | TRANSDERMAL | Status: DC

## 2012-04-11 MED ORDER — LANOLIN HYDROUS EX OINT: 1.0000 "application " | TOPICAL_OINTMENT | CUTANEOUS | Status: DC | PRN

## 2012-04-11 MED ORDER — SODIUM CHLORIDE 0.9 % IJ SOLN: 3.0000 mL | INTRAMUSCULAR | Status: DC | PRN

## 2012-04-11 MED ORDER — KETOROLAC TROMETHAMINE 60 MG/2ML IM SOLN
INTRAMUSCULAR | Status: AC
  Administered 2012-04-11: 60 mg via INTRAMUSCULAR
  Filled 2012-04-11: qty 2

## 2012-04-11 MED ORDER — METOCLOPRAMIDE HCL 5 MG/ML IJ SOLN: 10.0000 mg | Freq: Three times a day (TID) | INTRAMUSCULAR | Status: DC | PRN

## 2012-04-11 MED ORDER — IBUPROFEN 600 MG PO TABS
600.0000 mg | ORAL_TABLET | Freq: Four times a day (QID) | ORAL | Status: DC
  Administered 2012-04-11 – 2012-04-13 (×7): 600 mg via ORAL
  Filled 2012-04-11 (×7): qty 1

## 2012-04-11 MED ORDER — SIMETHICONE 80 MG PO CHEW
80.0000 mg | CHEWABLE_TABLET | Freq: Three times a day (TID) | ORAL | Status: DC
  Administered 2012-04-11 – 2012-04-13 (×5): 80 mg via ORAL

## 2012-04-11 NOTE — Anesthesia Postprocedure Evaluation (Signed)
  Anesthesia Post-op Note  Patient: Shannon Vaughan  Procedure(s) Performed: Procedure(s) (LRB): CESAREAN SECTION WITH BILATERAL TUBAL LIGATION ()  Patient Location: Mother/Baby  Anesthesia Type: Spinal  Level of Consciousness: awake, alert  and oriented  Airway and Oxygen Therapy: Patient Spontanous Breathing  Post-op Pain: mild  Post-op Assessment: Patient's Cardiovascular Status Stable and Respiratory Function Stable  Post-op Vital Signs: stable  Complications: No apparent anesthesia complications

## 2012-04-11 NOTE — Op Note (Signed)
Preoperative diagnosis: Intrauterine pregnancy at 36 weeks, PPROM, previous c-section x 2, desires sterility Postoperative diagnosis: Same Procedure: Repeat low transverse cesarean section without extensions, bilateral partial salpingectomy Surgeon: Lavina Hamman M.D. Anesthesia: Spinal Findings: Patient had normal gravid anatomy and delivered a viable female infant with Apgars of 9 and 9 weight pending Estimated blood loss: 800 cc Specimens: Placenta sent to labor and delivery Complications: None  Procedure in detail: The patient was taken to the operating room and placed in the sitting position. Dr. Cristela Blue instilled spinal anesthesia. Abdomen was then prepped and draped in the usual sterile fashion. A Foley catheter was been placed. The level of her anesthesia was found to be adequate. Abdomen was entered via a standard Pfannenstiel incision through her previous scar. Once the peritoneal cavity was entered the Alexis disposable self-retaining retractor was placed and good visualization was achieved. A 4 cm transverse incision was then made in the lower uterine segment pushing the bladder inferior. Once the uterine cavity was entered the incision was extended digitally. The fetal vertex was grasped and delivered through the incision atraumatically. Mouth and nares were suctioned. The remainder of the infant then delivered atraumatically. Cord was doubly clamped and cut and the infant handed to the awaiting pediatric team. Cord blood was obtained. The placenta delivered spontaneously. Uterus was wiped dry with clean lap pad and all clots and debris were removed. Uterine incision was inspected and found to be free of extensions. Uterine incision with running locking  #1 chromic. 2 figure 8 sutures of #1 Chromic were placed for hemostasis.  Tubes and ovaries were inspected and found to be normal. Uterine incision was inspected and found to be hemostatic. Bleeding from serosal edges was controlled  with electrocautery.   Attention was turned to tubal ligation.  Both tubes were identified and traced to their fimbriated ends.  The middle of each tube was grasped with a Babcock clamp.  A window was made in an avascular portion of the mesosalpinx.  0 plain gut suture was passed through this window, tied proximally, then wrapped around the tube to create a knuckle of tube.  The knuckle of tube was then removed sharply.  On both sides, both tubal ostia were identified and the stumps were hemostatic.  The Alexis retractor was removed. Subfascial space was irrigated and made hemostatic with electrocautery. Fascia was closed in running fashion starting at both ends and meeting in the middle with 0 Vicryl. Subcutaneous tissue was then irrigated and made hemostatic with electrocautery and closed with running 2-0 plain gut after freeing up the inferior edge with electrocautery. Skin was closed with staples followed by a sterile dressing. Patient tolerated the procedure well and was taken to the recovery in stable condition. Counts were correct x2, she received Gentamicin and Clindamycin at the beginning of the procedure and she had PAS hose on throughout the procedure.

## 2012-04-11 NOTE — Addendum Note (Signed)
Addendum  created 04/11/12 1203 by Earmon Phoenix, CRNA   Modules edited:Notes Section

## 2012-04-11 NOTE — Anesthesia Postprocedure Evaluation (Signed)
  Anesthesia Post-op Note  Patient: Shannon Vaughan  Procedure(s) Performed: Procedure(s) (LRB): CESAREAN SECTION WITH BILATERAL TUBAL LIGATION ()   Patient is awake, responsive, moving her legs, and has signs of resolution of her numbness. Pain and nausea are reasonably well controlled. Vital signs are stable and clinically acceptable. Oxygen saturation is clinically acceptable. There are no apparent anesthetic complications at this time. Patient is ready for discharge.

## 2012-04-11 NOTE — Transfer of Care (Signed)
Immediate Anesthesia Transfer of Care Note  Patient: Shannon Vaughan  Procedure(s) Performed: Procedure(s) (LRB): CESAREAN SECTION (N/A)  Patient Location: PACU  Anesthesia Type: Spinal  Level of Consciousness: awake, alert  and oriented  Airway & Oxygen Therapy: Patient Spontanous Breathing  Post-op Assessment: Report given to PACU RN and Post -op Vital signs reviewed and stable  Post vital signs: Reviewed and stable  Complications: No apparent anesthesia complications

## 2012-04-12 LAB — CBC
MCH: 28.6 pg (ref 26.0–34.0)
MCHC: 32.9 g/dL (ref 30.0–36.0)
MCV: 86.9 fL (ref 78.0–100.0)
Platelets: 180 10*3/uL (ref 150–400)
RBC: 3.43 MIL/uL — ABNORMAL LOW (ref 3.87–5.11)

## 2012-04-12 NOTE — Progress Notes (Signed)
Subjective: Postpartum Day #1: Cesarean Delivery Patient reports incisional pain, tolerating PO and no problems voiding.    Objective: Vital signs in last 24 hours: Temp:  [97.6 F (36.4 C)-98.1 F (36.7 C)] 98.1 F (36.7 C) (06/30 0700) Pulse Rate:  [69-78] 75  (06/30 0700) Resp:  [20] 20  (06/30 0700) BP: (102-123)/(66-72) 108/72 mmHg (06/30 0700) SpO2:  [96 %-97 %] 96 % (06/29 1647)  Physical Exam:  General: alert Lochia: appropriate Uterine Fundus: firm Incision: dressing C/D/I   Basename 04/12/12 0540 04/10/12 1925  HGB 9.8* 11.0*  HCT 29.8* 32.7*    Assessment/Plan: Status post Cesarean section. Doing well postoperatively.  Continue current care, ambulate.  Luca Burston D 04/12/2012, 10:42 AM

## 2012-04-13 MED ORDER — OXYCODONE-ACETAMINOPHEN 5-325 MG PO TABS: 1.0000 | ORAL_TABLET | ORAL | Status: AC | PRN

## 2012-04-13 MED ORDER — IBUPROFEN 600 MG PO TABS: 600.0000 mg | ORAL_TABLET | Freq: Four times a day (QID) | ORAL | Status: AC

## 2012-04-13 NOTE — Progress Notes (Signed)
POD #2 repeat LTCS No problems, wants to go home Afeb, VSS Abd- soft, incision intact D/c home

## 2012-04-13 NOTE — Progress Notes (Signed)
Post discharge review completed. 

## 2012-04-13 NOTE — Discharge Instructions (Signed)
As per discharge pamphlet °

## 2012-04-13 NOTE — Discharge Summary (Signed)
Obstetric Discharge Summary Reason for Admission: rupture of membranes Prenatal Procedures: none Intrapartum Procedures: cesarean: low cervical, transverse and tubal ligation Postpartum Procedures: none Complications-Operative and Postpartum: none Hemoglobin  Date Value Range Status  04/12/2012 9.8* 12.0 - 15.0 g/dL Final     HCT  Date Value Range Status  04/12/2012 29.8* 36.0 - 46.0 % Final    Physical Exam:  General: alert Lochia: appropriate Uterine Fundus: firm Incision: healing well  Discharge Diagnoses: IUP at 36+ weeks, previous cesarean section, PPROM, desires surgical sterility  Discharge Information: Date: 04/13/2012 Activity: pelvic rest and no strenuous activity Diet: routine Medications: Ibuprofen and Percocet Condition: stable Instructions: refer to practice specific booklet Discharge to: home Follow-up Information    Follow up with Rodell Perna., NP. Schedule an appointment as soon as possible for a visit in 4 days. (for staple removal)    Contact information:   [address] [place] [address] [phone]          Newborn Data: Live born female  Birth Weight: 5 lb 14 oz (2665 g) APGAR: 8, 9  Home with mother.  Dequavius Kuhner D 04/13/2012, 8:46 AM

## 2012-04-28 ENCOUNTER — Inpatient Hospital Stay (HOSPITAL_COMMUNITY)
Admission: RE | Admit: 2012-04-28 | Payer: Medicaid Other | Source: Ambulatory Visit | Admitting: Obstetrics and Gynecology

## 2012-04-28 ENCOUNTER — Encounter (HOSPITAL_COMMUNITY): Admission: RE | Payer: Self-pay | Source: Ambulatory Visit

## 2012-04-28 SURGERY — Surgical Case
Anesthesia: Regional | Laterality: Bilateral

## 2014-04-26 ENCOUNTER — Encounter (HOSPITAL_COMMUNITY): Payer: Self-pay | Admitting: Emergency Medicine

## 2014-04-26 ENCOUNTER — Emergency Department (HOSPITAL_COMMUNITY): Payer: 59

## 2014-04-26 ENCOUNTER — Emergency Department (HOSPITAL_COMMUNITY)
Admission: EM | Admit: 2014-04-26 | Discharge: 2014-04-26 | Disposition: A | Discharge status: Home / Self Care | Payer: 59 | Attending: Emergency Medicine | Admitting: Emergency Medicine

## 2014-04-26 DIAGNOSIS — F17200 Nicotine dependence, unspecified, uncomplicated: Secondary | ICD-10-CM | POA: Insufficient documentation

## 2014-04-26 DIAGNOSIS — IMO0002 Reserved for concepts with insufficient information to code with codable children: Secondary | ICD-10-CM | POA: Insufficient documentation

## 2014-04-26 DIAGNOSIS — Z880 Allergy status to penicillin: Secondary | ICD-10-CM | POA: Insufficient documentation

## 2014-04-26 DIAGNOSIS — M62830 Muscle spasm of back: Secondary | ICD-10-CM

## 2014-04-26 DIAGNOSIS — J45909 Unspecified asthma, uncomplicated: Secondary | ICD-10-CM | POA: Insufficient documentation

## 2014-04-26 DIAGNOSIS — Z872 Personal history of diseases of the skin and subcutaneous tissue: Secondary | ICD-10-CM | POA: Insufficient documentation

## 2014-04-26 DIAGNOSIS — Z79899 Other long term (current) drug therapy: Secondary | ICD-10-CM | POA: Insufficient documentation

## 2014-04-26 MED ORDER — METHOCARBAMOL 500 MG PO TABS: 500.0000 mg | ORAL_TABLET | Freq: Two times a day (BID) | ORAL | Status: DC

## 2014-04-26 MED ORDER — LORAZEPAM 2 MG/ML IJ SOLN
0.5000 mg | Freq: Once | INTRAMUSCULAR | Status: AC
  Administered 2014-04-26: 0.5 mg via INTRAVENOUS
  Filled 2014-04-26: qty 1

## 2014-04-26 MED ORDER — HYDROCODONE-ACETAMINOPHEN 5-325 MG PO TABS: 2.0000 | ORAL_TABLET | ORAL | Status: DC | PRN

## 2014-04-26 MED ORDER — MORPHINE SULFATE 4 MG/ML IJ SOLN
4.0000 mg | Freq: Once | INTRAMUSCULAR | Status: AC
  Administered 2014-04-26: 4 mg via INTRAVENOUS
  Filled 2014-04-26: qty 1

## 2014-04-26 NOTE — Discharge Instructions (Signed)
Use ibuprofen as directed. Use the muscle relaxant and pain medication only at night when you are at home  Muscle Strain A muscle strain is an injury that occurs when a muscle is stretched beyond its normal length. Usually a small number of muscle fibers are torn when this happens. Muscle strain is rated in degrees. First-degree strains have the least amount of muscle fiber tearing and pain. Second-degree and third-degree strains have increasingly more tearing and pain.  Usually, recovery from muscle strain takes 1-2 weeks. Complete healing takes 5-6 weeks.  CAUSES  Muscle strain happens when a sudden, violent force placed on a muscle stretches it too far. This may occur with lifting, sports, or a fall.  RISK FACTORS Muscle strain is especially common in athletes.  SIGNS AND SYMPTOMS At the site of the muscle strain, there may be:  Pain.  Bruising.  Swelling.  Difficulty using the muscle due to pain or lack of normal function. DIAGNOSIS  Your health care provider will perform a physical exam and ask about your medical history. TREATMENT  Often, the best treatment for a muscle strain is resting, icing, and applying cold compresses to the injured area.  HOME CARE INSTRUCTIONS   Use the PRICE method of treatment to promote muscle healing during the first 2-3 days after your injury. The PRICE method involves:  Protecting the muscle from being injured again.  Restricting your activity and resting the injured body part.  Icing your injury. To do this, put ice in a plastic bag. Place a towel between your skin and the bag. Then, apply the ice and leave it on from 15-20 minutes each hour. After the third day, switch to moist heat packs.  Apply compression to the injured area with a splint or elastic bandage. Be careful not to wrap it too tightly. This may interfere with blood circulation or increase swelling.  Elevate the injured body part above the level of your heart as often as you  can.  Only take over-the-counter or prescription medicines for pain, discomfort, or fever as directed by your health care provider.  Warming up prior to exercise helps to prevent future muscle strains. SEEK MEDICAL CARE IF:   You have increasing pain or swelling in the injured area.  You have numbness, tingling, or a significant loss of strength in the injured area. MAKE SURE YOU:   Understand these instructions.  Will watch your condition.  Will get help right away if you are not doing well or get worse. Document Released: 09/30/2005 Document Revised: 07/21/2013 Document Reviewed: 04/29/2013 [facility] Patient Information 2015 [place], [place]. This information is not intended to replace advice given to you by your health care provider. Make sure you discuss any questions you have with your health care provider.

## 2014-04-26 NOTE — ED Notes (Addendum)
Pt from home. Called EMS for R side chest pain radiating to back that began at 0630 this am. Pain worse with movements. Denies sob or dizziness. Pain decreased after EMS gave 30mg  tordol.

## 2014-04-26 NOTE — ED Provider Notes (Signed)
CSN: [id number]     Arrival date & time 04/26/14  7846 History   First MD Initiated Contact with Patient 04/26/14 [phone]     Chief Complaint  Patient presents with  . Chest Pain     (Consider location/radiation/quality/duration/timing/severity/associated sxs/prior Treatment) The history is provided by the patient.   Patient here complaining of right-sided scapular pain radiating to her right chest. Symptoms began suddenly this morning and worse with movement. Denies any anginal type chest pain. No dyspnea. Symptoms are better with remaining still and worse with any kind of movement. Denies any new injury. No syncope or near-syncope. EMS was called and gave patient Toradol and she feels much better at this time. Past Medical History  Diagnosis Date  . Asthma   . Eczema    Past Surgical History  Procedure Laterality Date  . Cesarean section     Family History  Problem Relation Age of Onset  . Hypertension Mother   . Diabetes Mother   . Hypertension Father   . Diabetes Father   . Other Neg Hx    History  Substance Use Topics  . Smoking status: Current Some Day Smoker -- 0.25 packs/day  . Smokeless tobacco: Not on file  . Alcohol Use: Yes   OB History   Grav Para Term Preterm Abortions TAB SAB Ect Mult Living   5 3 2 1 2 1 1   3      Review of Systems  All other systems reviewed and are negative.     Allergies  Penicillins and Aspirin  Home Medications   Prior to Admission medications   Medication Sig Start Date End Date Taking? Authorizing Provider  albuterol (PROVENTIL HFA;VENTOLIN HFA) 108 (90 BASE) MCG/ACT inhaler Inhale 2 puffs into the lungs every 6 (six) hours as needed. Shortness of breath     Historical Provider, MD  prenatal vitamin w/FE, FA (PRENATAL 1 + 1) 27-1 MG TABS Take 1 tablet by mouth daily.      Historical Provider, MD   BP 111/68  Pulse 62  Temp(Src) 97.8 F (36.6 C) (Oral)  Resp 16  SpO2 96% Physical Exam  Nursing note and vitals  reviewed. Constitutional: She is oriented to person, place, and time. She appears well-developed and well-nourished.  Non-toxic appearance. No distress.  HENT:  Head: Normocephalic and atraumatic.  Eyes: Conjunctivae, EOM and lids are normal. Pupils are equal, round, and reactive to light.  Neck: Normal range of motion. Neck supple. No tracheal deviation present. No mass present.  Cardiovascular: Normal rate, regular rhythm and normal heart sounds.  Exam reveals no gallop.   No murmur heard. Pulmonary/Chest: Effort normal and breath sounds normal. No stridor. No respiratory distress. She has no decreased breath sounds. She has no wheezes. She has no rhonchi. She has no rales.  Abdominal: Soft. Normal appearance and bowel sounds are normal. She exhibits no distension. There is no tenderness. There is no rebound and no CVA tenderness.  Musculoskeletal: Normal range of motion. She exhibits no edema and no tenderness.       Right shoulder: She exhibits pain and spasm.       Arms: Neurological: She is alert and oriented to person, place, and time. She has normal strength. No cranial nerve deficit or sensory deficit. GCS eye subscore is 4. GCS verbal subscore is 5. GCS motor subscore is 6.  Skin: Skin is warm and dry. No abrasion and no rash noted.  Psychiatric: She has a normal mood and affect. Her  speech is normal and behavior is normal.    ED Course  Procedures (including critical care time) Labs Review Labs Reviewed - No data to display  Imaging Review No results found.   EKG Interpretation   Date/Time:  Tuesday April 26 2014 07:47:54 EDT Ventricular Rate:  66 PR Interval:  145 QRS Duration: 74 QT Interval:  391 QTC Calculation: 410 R Axis:   39 Text Interpretation:  Sinus rhythm Confirmed by Benson Porcaro  MD, Sindee Stucker ([id number])  on 04/26/2014 8:06:04 AM      MDM   Final diagnoses:  None   Pt given meds here feels better. Muscle tenderness has greatly improved. She relates being under  a great deal of stress and did ask me this could cause her symptoms. No concern for ACS or PE. Chest x-ray was negative per radiology. Pulse oximetry stable.    Toy BakerAnthony T Nalu Troublefield, MD 04/26/14 [phone]

## 2014-04-26 NOTE — ED Notes (Signed)
Bed: [id number] Expected date:  Expected time:  Means of arrival:  Comments: EMS-chest pressure-relieved with toradol

## 2014-08-15 ENCOUNTER — Encounter (HOSPITAL_COMMUNITY): Payer: Self-pay | Admitting: Emergency Medicine

## 2015-05-23 ENCOUNTER — Ambulatory Visit (INDEPENDENT_AMBULATORY_CARE_PROVIDER_SITE_OTHER): Payer: PRIVATE HEALTH INSURANCE | Admitting: Family Medicine

## 2015-05-23 ENCOUNTER — Encounter: Payer: Self-pay | Admitting: Family Medicine

## 2015-05-23 ENCOUNTER — Other Ambulatory Visit (HOSPITAL_COMMUNITY)
Admission: RE | Admit: 2015-05-23 | Discharge: 2015-05-23 | Disposition: A | Discharge status: Home / Self Care | Payer: PRIVATE HEALTH INSURANCE | Source: Ambulatory Visit | Attending: Family Medicine | Admitting: Family Medicine

## 2015-05-23 VITALS — BP 118/64 | HR 72 | Ht 67.0 in | Wt 207.8 lb

## 2015-05-23 DIAGNOSIS — Z0000 Encounter for general adult medical examination without abnormal findings: Secondary | ICD-10-CM

## 2015-05-23 DIAGNOSIS — Z1151 Encounter for screening for human papillomavirus (HPV): Secondary | ICD-10-CM | POA: Diagnosis present

## 2015-05-23 DIAGNOSIS — L309 Dermatitis, unspecified: Secondary | ICD-10-CM | POA: Diagnosis not present

## 2015-05-23 DIAGNOSIS — J4540 Moderate persistent asthma, uncomplicated: Secondary | ICD-10-CM | POA: Diagnosis not present

## 2015-05-23 DIAGNOSIS — Z008 Encounter for other general examination: Secondary | ICD-10-CM | POA: Diagnosis not present

## 2015-05-23 DIAGNOSIS — Z720 Tobacco use: Secondary | ICD-10-CM

## 2015-05-23 DIAGNOSIS — Z113 Encounter for screening for infections with a predominantly sexual mode of transmission: Secondary | ICD-10-CM

## 2015-05-23 DIAGNOSIS — B3731 Acute candidiasis of vulva and vagina: Secondary | ICD-10-CM

## 2015-05-23 DIAGNOSIS — J45909 Unspecified asthma, uncomplicated: Secondary | ICD-10-CM | POA: Insufficient documentation

## 2015-05-23 DIAGNOSIS — B373 Candidiasis of vulva and vagina: Secondary | ICD-10-CM

## 2015-05-23 DIAGNOSIS — J302 Other seasonal allergic rhinitis: Secondary | ICD-10-CM

## 2015-05-23 DIAGNOSIS — Z01419 Encounter for gynecological examination (general) (routine) without abnormal findings: Secondary | ICD-10-CM | POA: Diagnosis not present

## 2015-05-23 DIAGNOSIS — R252 Cramp and spasm: Secondary | ICD-10-CM

## 2015-05-23 DIAGNOSIS — F17200 Nicotine dependence, unspecified, uncomplicated: Secondary | ICD-10-CM | POA: Insufficient documentation

## 2015-05-23 LAB — COMPREHENSIVE METABOLIC PANEL
ALBUMIN: 4.1 g/dL (ref 3.6–5.1)
ALK PHOS: 67 U/L (ref 33–115)
ALT: 10 U/L (ref 6–29)
AST: 19 U/L (ref 10–30)
BILIRUBIN TOTAL: 0.3 mg/dL (ref 0.2–1.2)
BUN: 15 mg/dL (ref 7–25)
CHLORIDE: 103 mmol/L (ref 98–110)
CO2: 27 mmol/L (ref 20–31)
CREATININE: 1.05 mg/dL (ref 0.50–1.10)
Calcium: 9.3 mg/dL (ref 8.6–10.2)
GLUCOSE: 70 mg/dL (ref 65–99)
Potassium: 4.6 mmol/L (ref 3.5–5.3)
Sodium: 140 mmol/L (ref 135–146)
Total Protein: 7.3 g/dL (ref 6.1–8.1)

## 2015-05-23 LAB — POCT URINALYSIS DIPSTICK
BILIRUBIN UA: NEGATIVE
Blood, UA: NEGATIVE
Glucose, UA: NEGATIVE
Ketones, UA: NEGATIVE
Leukocytes, UA: NEGATIVE
Nitrite, UA: NEGATIVE
PH UA: 6
Protein, UA: NEGATIVE
Spec Grav, UA: >=1.03
Urobilinogen, UA: NEGATIVE

## 2015-05-23 LAB — CBC WITH DIFFERENTIAL/PLATELET
BASOS ABS: 0.1 10*3/uL (ref 0.0–0.1)
Basophils Relative: 1 % (ref 0–1)
EOS PCT: 5 % (ref 0–5)
Eosinophils Absolute: 0.3 10*3/uL (ref 0.0–0.7)
HEMATOCRIT: 39.9 % (ref 36.0–46.0)
HEMOGLOBIN: 13 g/dL (ref 12.0–15.0)
LYMPHS ABS: 2.7 10*3/uL (ref 0.7–4.0)
Lymphocytes Relative: 42 % (ref 12–46)
MCH: 27.7 pg (ref 26.0–34.0)
MCHC: 32.6 g/dL (ref 30.0–36.0)
MCV: 84.9 fL (ref 78.0–100.0)
MONO ABS: 0.4 10*3/uL (ref 0.1–1.0)
MONOS PCT: 7 % (ref 3–12)
MPV: 9.8 fL (ref 8.6–12.4)
Neutro Abs: 2.9 10*3/uL (ref 1.7–7.7)
Neutrophils Relative %: 45 % (ref 43–77)
PLATELETS: 261 10*3/uL (ref 150–400)
RBC: 4.7 MIL/uL (ref 3.87–5.11)
RDW: 14.6 % (ref 11.5–15.5)
WBC: 6.4 10*3/uL (ref 4.0–10.5)

## 2015-05-23 MED ORDER — ALBUTEROL SULFATE HFA 108 (90 BASE) MCG/ACT IN AERS: 2.0000 | INHALATION_SPRAY | Freq: Four times a day (QID) | RESPIRATORY_TRACT | Status: DC | PRN

## 2015-05-23 MED ORDER — MONTELUKAST SODIUM 10 MG PO TABS: 10.0000 mg | ORAL_TABLET | Freq: Every day | ORAL | Status: DC

## 2015-05-23 MED ORDER — FLUCONAZOLE 150 MG PO TABS: 150.0000 mg | ORAL_TABLET | Freq: Once | ORAL | Status: DC

## 2015-05-23 MED ORDER — TRIAMCINOLONE ACETONIDE 0.1 % EX CREA: 1.0000 "application " | TOPICAL_CREAM | Freq: Two times a day (BID) | CUTANEOUS | Status: DC

## 2015-05-23 MED ORDER — MOMETASONE FUROATE 220 MCG/INH IN AEPB: 2.0000 | INHALATION_SPRAY | Freq: Every day | RESPIRATORY_TRACT | Status: DC

## 2015-05-23 NOTE — Patient Instructions (Signed)
Hydrate and do lower leg stretches I demonstrated in the office before bedtime for leg cramps.

## 2015-05-23 NOTE — Progress Notes (Signed)
Subjective:    Patient ID: Shannon Vaughan, female    DOB: [birth year], 39 y.o.   MRN: [id number]  HPI Pt is new to the practice, here for complete physical exam and has multiple issues. She has asthma and is currently only being treated with albuterol inhaler. She states she is needing her rescue inhaler 2-3 times per day and almost every morning upon awakening. She has had asthma since childhood and states it seems to be worse since her having her 3rd child 2 years ago. Her last asthma exacerbation was 2-3 months ago. She has been hospitalized in the past for her asthma and intubated. She has a peak flow meter at home but has not used it. Her triggers seem to be allergy related and weather related.  She also has eczema and states it only affects her hands and feet. She has been using her children's triamcinolone cream for her eczema. She has seasonal allergies.  She also complains of legs cramping at night and this has been ongoing and is occasional.  Would like to have a pap today and also requests STD screening. Complains of vaginal discharge, odor and irritation that has been occuring after her menses for past 2 months. Her cycles are regular and last 5-7 days. She is married and has 3 children. Works as a Engineer, materials.   She smokes cigarettes, has an occasional glass or two of wine on the weekends and denies drug use.   Reviewed past medical, social and family history, immunizations, and health maintenance.    Review of Systems   Review of Systems Constitutional: -fever, -chills, -sweats, -unexpected weight change,-fatigue ENT: -runny nose, -ear pain, -sore throat Cardiology:  -chest pain, -palpitations, -edema Respiratory: +cough, -shortness of breath, -wheezing Gastroenterology: -abdominal pain, -nausea, -vomiting, -diarrhea, -constipation Hematology: -bleeding or bruising problems Musculoskeletal: -arthralgias, +myalgias (legs cramping at night), -joint swelling, -back  pain Ophthalmology: -vision changes Urology: -dysuria, -difficulty urinating, -hematuria, -urinary frequency, -urgency Neurology: -headache, -weakness, -tingling, -numbness       Objective:   Physical Exam BP 118/64 mmHg  Pulse 72  Ht 5\' 7"  (1.702 m)  Wt 207 lb 12.8 oz (94.257 kg)  BMI 32.54 kg/m2  LMP 05/08/2015  Breastfeeding? No  General Appearance:    Alert, cooperative, no distress, appears stated age  Head:    Normocephalic, without obvious abnormality, atraumatic  Eyes:    PERRL, conjunctiva/corneas clear, EOM's intact  Ears:    Normal TM's and external ear canals  Nose:   Nares normal, mucosa normal, no drainage or sinus   tenderness  Throat:   Lips, mucosa, and tongue normal; teeth and gums normal  Neck:   Supple, no lymphadenopathy;  thyroid:  no   enlargement/tenderness/nodules  Back:    Spine nontender, no curvature, ROM normal  Lungs:     Clear to auscultation bilaterally without wheezes, rales or     ronchi; respirations unlabored  Chest Wall:    No tenderness or deformity   Heart:    Regular rate and rhythm, S1 and S2 normal, no murmur, rub   or gallop  Breast Exam:    No tenderness, masses, or nipple discharge or inversion.      No axillary lymphadenopathy  Abdomen:     Soft, mildly tender over C-section scar, nondistended, normoactive bowel sounds,    no masses, no hepatosplenomegaly  Genitalia:    Normal external genitalia without lesions.  vagina normal; cervix without lesions, or cervical motion tenderness. White thick clumpy abnormal  vaginal discharge.  Uterus and adnexa not enlarged, mildly tender, no masses.  Pap performed  Rectal:    Not performed due to age<40 and no related complaints  Extremities:   No clubbing, cyanosis or edema  Pulses:   2+ and symmetric all extremities  Skin:   Skin color, texture, turgor normal, no rashes or lesions  Lymph nodes:   Cervical, supraclavicular, and axillary nodes normal  Neurologic:   CNII-XII intact, normal  strength, sensation and gait; reflexes 2+ and symmetric throughout          Psych:   Normal mood, affect, hygiene and grooming.     Spirometry results showed mild restriction. FeV1/FVC ratio 99% Urinalysis negative.  Wet mount revealed yeast cells. Negative clue cells and whiff test.      Assessment & Plan:    Routine general medical examination at a health care facility - Plan: POCT urinalysis dipstick, Cytology - PAP [place], CBC with Differential, Comprehensive metabolic panel  Asthma, chronic, moderate persistent, uncomplicated - Plan: Spirometry with Graph, albuterol (PROVENTIL HFA;VENTOLIN HFA) 108 (90 BASE) MCG/ACT inhaler, mometasone (ASMANEX) 220 MCG/INH inhaler, montelukast (SINGULAIR) 10 MG tablet  Eczema - Plan: triamcinolone cream (KENALOG) 0.1 %  Cramp of both lower extremities  Screen for STD (sexually transmitted disease) - Plan: GC/Chlamydia Probe Amp, HIV antibody (with reflex), RPR, POCT Wet Prep [facility])  Encounter for pulmonary function testing  Smoker  Candida vaginitis - Plan: fluconazole (DIFLUCAN) 150 MG tablet  Discussed spirometry results with patient and educated her on new asthma medications. Also educated patient that she should not be needing her rescue inhaler after getting her maintenance medications on board and if she is still needing to use rescue inhaler more than 1-2 times per week, we will need to adjust medications accordingly. Patient has a peak flow meter at home and instructed her to use this when she feels like she is having an asthma flare to determine severity. Also discussed triggers and that she should stop smoking in order to improve her condition. Asmanex sample given today.  Instructed her that the triamcinolone cream for her hands and feet is not to be used daily but only during Eczema flares.  Discussed hydrating and stretching lower legs prior to going to bed may help to minimize nighttime leg cramping. Educated her on Pap  guidelines.  Will treat for yeast infection with Diflucan and she will let me know if this does not resolve.  Will follow up pending lab results.

## 2015-05-24 LAB — POCT WET PREP (WET MOUNT)
Clue Cells Wet Prep Whiff POC: NEGATIVE
KOH Wet Prep POC: POSITIVE
Trichomonas Wet Prep HPF POC: NEGATIVE

## 2015-05-24 LAB — GC/CHLAMYDIA PROBE AMP
CT Probe RNA: NEGATIVE
GC Probe RNA: NEGATIVE

## 2015-05-24 LAB — RPR

## 2015-05-24 LAB — HIV ANTIBODY (ROUTINE TESTING W REFLEX): HIV: NONREACTIVE

## 2015-05-29 LAB — CYTOLOGY - PAP

## 2015-06-06 ENCOUNTER — Ambulatory Visit (INDEPENDENT_AMBULATORY_CARE_PROVIDER_SITE_OTHER): Payer: PRIVATE HEALTH INSURANCE | Admitting: Family Medicine

## 2015-06-06 ENCOUNTER — Encounter: Payer: Self-pay | Admitting: Family Medicine

## 2015-06-06 VITALS — BP 120/82 | HR 70 | Ht 67.0 in | Wt 211.0 lb

## 2015-06-06 DIAGNOSIS — J4530 Mild persistent asthma, uncomplicated: Secondary | ICD-10-CM

## 2015-06-06 DIAGNOSIS — Z720 Tobacco use: Secondary | ICD-10-CM | POA: Diagnosis not present

## 2015-06-06 DIAGNOSIS — F17200 Nicotine dependence, unspecified, uncomplicated: Secondary | ICD-10-CM

## 2015-06-06 DIAGNOSIS — E669 Obesity, unspecified: Secondary | ICD-10-CM | POA: Insufficient documentation

## 2015-06-06 DIAGNOSIS — J302 Other seasonal allergic rhinitis: Secondary | ICD-10-CM | POA: Diagnosis not present

## 2015-06-06 NOTE — Patient Instructions (Signed)
Asthma maintenance - use your Asmanex inhaler twice daily for 2 weeks and see if this helps you feel more comfortable Use you peak flow meter as discussed. Continue your singulair. Let me know if you are having to use the albuterol inhaler more than one time per week other than exercise. Use your albuterol inhaler 30 minutes before you exercise. Let me know if you are having difficulty after exercise.    Walking a fast pace for 30 minutes is good. Aim for 1 mile in 30 minutes and if that is relatively easy aim for 1 1/2 miles in 30 mins. My [employer] is free app for food. Nike run app is free also and will help you with exercise.    DASH Eating Plan DASH stands for "Dietary Approaches to Stop Hypertension." The DASH eating plan is a healthy eating plan that has been shown to reduce high blood pressure (hypertension). Additional health benefits may include reducing the risk of type 2 diabetes mellitus, heart disease, and stroke. The DASH eating plan may also help with weight loss. WHAT DO I NEED TO KNOW ABOUT THE DASH EATING PLAN? For the DASH eating plan, you will follow these general guidelines:  Choose foods with a percent daily value for sodium of less than 5% (as listed on the food label).  Use salt-free seasonings or herbs instead of table salt or sea salt.  Check with your health care provider or pharmacist before using salt substitutes.  Eat lower-sodium products, often labeled as "lower sodium" or "no salt added."  Eat fresh foods.  Eat more vegetables, fruits, and low-fat dairy products.  Choose whole grains. Look for the word "whole" as the first word in the ingredient list.  Choose fish and skinless chicken or [place] more often than red meat. Limit fish, poultry, and meat to 6 oz (170 g) each day.  Limit sweets, desserts, sugars, and sugary drinks.  Choose heart-healthy fats.  Limit cheese to 1 oz (28 g) per day.  Eat more home-cooked food and less restaurant, buffet,  and fast food.  Limit fried foods.  Cook foods using methods other than frying.  Limit canned vegetables. If you do use them, rinse them well to decrease the sodium.  When eating at a restaurant, ask that your food be prepared with less salt, or no salt if possible. WHAT FOODS CAN I EAT? Seek help from a dietitian for individual calorie needs. Grains Whole grain or whole wheat bread. Brown rice. Whole grain or whole wheat pasta. Quinoa, bulgur, and whole grain cereals. Low-sodium cereals. Corn or whole wheat flour tortillas. Whole grain cornbread. Whole grain crackers. Low-sodium crackers. Vegetables Fresh or frozen vegetables (raw, steamed, roasted, or grilled). Low-sodium or reduced-sodium tomato and vegetable juices. Low-sodium or reduced-sodium tomato sauce and paste. Low-sodium or reduced-sodium canned vegetables.  Fruits All fresh, canned (in natural juice), or frozen fruits. Meat and Other Protein Products Ground beef (85% or leaner), grass-fed beef, or beef trimmed of fat. Skinless chicken or [place]. Ground chicken or [place]. Pork trimmed of fat. All fish and seafood. Eggs. Dried beans, peas, or lentils. Unsalted nuts and seeds. Unsalted canned beans. Dairy Low-fat dairy products, such as skim or 1% milk, 2% or reduced-fat cheeses, low-fat ricotta or cottage cheese, or plain low-fat yogurt. Low-sodium or reduced-sodium cheeses. Fats and Oils Tub margarines without trans fats. Light or reduced-fat mayonnaise and salad dressings (reduced sodium). Avocado. Safflower, olive, or canola oils. Natural peanut or almond butter. Other Unsalted popcorn and pretzels.  The items listed above may not be a complete list of recommended foods or beverages. Contact your dietitian for more options. WHAT FOODS ARE NOT RECOMMENDED? Grains White bread. White pasta. White rice. Refined cornbread. Bagels and croissants. Crackers that contain trans fat. Vegetables Creamed or fried vegetables. Vegetables  in a cheese sauce. Regular canned vegetables. Regular canned tomato sauce and paste. Regular tomato and vegetable juices. Fruits Dried fruits. Canned fruit in light or heavy syrup. Fruit juice. Meat and Other Protein Products Fatty cuts of meat. Ribs, chicken wings, bacon, sausage, bologna, salami, chitterlings, fatback, hot dogs, bratwurst, and packaged luncheon meats. Salted nuts and seeds. Canned beans with salt. Dairy Whole or 2% milk, cream, half-and-half, and cream cheese. Whole-fat or sweetened yogurt. Full-fat cheeses or blue cheese. Nondairy creamers and whipped toppings. Processed cheese, cheese spreads, or cheese curds. Condiments Onion and garlic salt, seasoned salt, table salt, and sea salt. Canned and packaged gravies. Worcestershire sauce. Tartar sauce. Barbecue sauce. Teriyaki sauce. Soy sauce, including reduced sodium. Steak sauce. Fish sauce. Oyster sauce. Cocktail sauce. Horseradish. Ketchup and mustard. Meat flavorings and tenderizers. Bouillon cubes. Hot sauce. Tabasco sauce. Marinades. Taco seasonings. Relishes. Fats and Oils Butter, stick margarine, lard, shortening, ghee, and bacon fat. Coconut, palm kernel, or palm oils. Regular salad dressings. Other Pickles and olives. Salted popcorn and pretzels. The items listed above may not be a complete list of foods and beverages to avoid. Contact your dietitian for more information. WHERE CAN I FIND MORE INFORMATION? National Heart, Lung, and Blood Institute: [employer] Document Released: 09/19/2011 Document Revised: 02/14/2014 Document Reviewed: 08/04/2013 [facility] Patient Information 2015 [place], [place]. This information is not intended to replace advice given to you by your health care provider. Make sure you discuss any questions you have with your health care provider.

## 2015-06-06 NOTE — Progress Notes (Signed)
   Subjective:    Patient ID: Shannon Vaughan, female    DOB: 1976/05/12, 39 y.o.   MRN: [id number]  HPI She is here for a two-week follow-up for her asthma. She has been taking Singulair daily and using Asmanex intermittently and not as prescribed. States she only needed to use her albuterol inhaler twice in past week and half. She has not been using her peak flow meter and states she forgot to do this. She reports her breathing is much better. She states she is trying to lose weight by drinking water and using Lime and lemon juice. She has been skipping meals. She states exercise triggers her asthma. She continues to smoke. Overall she states she is feeling better. She denies fever, chills, cough, shortness of breath, chest pain, palpitations.    Review of Systems Pertinent positives and negatives in the history of present illness.    Objective:   Physical Exam  Constitutional: She is oriented to person, place, and time. She appears well-developed and well-nourished. No distress.  Pulmonary/Chest: Effort normal and breath sounds normal. She has no wheezes.  Neurological: She is alert and oriented to person, place, and time.  Psychiatric: She has a normal mood and affect. Her speech is normal and behavior is normal. Judgment and thought content normal. Cognition and memory are normal.          Assessment & Plan:  Asthma, chronic, mild persistent, uncomplicated  Smoker  Seasonal allergies  Obesity  Discussed that she should be using her inhaled corticosteroid twice daily and that she should continue using Singulair daily as well. She will let me know if she has to use her albuterol inhaler more than once per week except when she is using it before she exercises. Since exercises a trigger for her asthma, I recommend that she use her albuterol inhaler 30 minutes before exercising. She should not be needing to use it after she exercises and if she does she will let me know this. She is  concerned about losing weight and would like to start exercising on a regular basis. Spent at least 15 minutes counseling her on eating small frequent meals throughout the day and not skipping meals. Eating a low carbohydrate diet, low salt diet, and told her about the free app My Fitness Pal. Also encouraged her to start walking daily and gradually increase her pace. Discussed that this is a lifestyle change and not solely about weight loss. She states she knows she needs to stop smoking but is not ready to do so yet. Encouraged her to get her flu shot today however she would like to wait on this. She will follow up in 3 months or sooner if needed.

## 2015-08-29 ENCOUNTER — Ambulatory Visit: Payer: PRIVATE HEALTH INSURANCE | Admitting: Family Medicine

## 2015-09-01 ENCOUNTER — Encounter: Payer: Self-pay | Admitting: Family Medicine

## 2016-02-26 ENCOUNTER — Other Ambulatory Visit (INDEPENDENT_AMBULATORY_CARE_PROVIDER_SITE_OTHER): Payer: No Typology Code available for payment source

## 2016-02-26 DIAGNOSIS — Z111 Encounter for screening for respiratory tuberculosis: Secondary | ICD-10-CM

## 2016-02-28 LAB — TB SKIN TEST
Induration: 0 mm
TB SKIN TEST: NEGATIVE

## 2017-02-13 ENCOUNTER — Encounter: Payer: Self-pay | Admitting: Family Medicine

## 2017-02-13 ENCOUNTER — Ambulatory Visit (INDEPENDENT_AMBULATORY_CARE_PROVIDER_SITE_OTHER): Payer: No Typology Code available for payment source | Admitting: Family Medicine

## 2017-02-13 VITALS — BP 122/80 | HR 76 | Temp 98.4°F | Resp 18 | Wt 214.8 lb

## 2017-02-13 DIAGNOSIS — J4530 Mild persistent asthma, uncomplicated: Secondary | ICD-10-CM

## 2017-02-13 DIAGNOSIS — F17200 Nicotine dependence, unspecified, uncomplicated: Secondary | ICD-10-CM | POA: Diagnosis not present

## 2017-02-13 DIAGNOSIS — J301 Allergic rhinitis due to pollen: Secondary | ICD-10-CM

## 2017-02-13 DIAGNOSIS — J4540 Moderate persistent asthma, uncomplicated: Secondary | ICD-10-CM | POA: Diagnosis not present

## 2017-02-13 MED ORDER — ALBUTEROL SULFATE HFA 108 (90 BASE) MCG/ACT IN AERS: 2.0000 | INHALATION_SPRAY | Freq: Four times a day (QID) | RESPIRATORY_TRACT | 2 refills | Status: DC | PRN

## 2017-02-13 MED ORDER — FLUTICASONE PROPIONATE 50 MCG/ACT NA SUSP: 2.0000 | Freq: Every day | NASAL | 2 refills | Status: DC

## 2017-02-13 MED ORDER — ALBUTEROL SULFATE (2.5 MG/3ML) 0.083% IN NEBU: 2.5000 mg | INHALATION_SOLUTION | Freq: Four times a day (QID) | RESPIRATORY_TRACT | 1 refills | Status: DC | PRN

## 2017-02-13 MED ORDER — MOMETASONE FUROATE 220 MCG/INH IN AEPB: 2.0000 | INHALATION_SPRAY | Freq: Every day | RESPIRATORY_TRACT | 5 refills | Status: DC

## 2017-02-13 MED ORDER — MONTELUKAST SODIUM 10 MG PO TABS: 10.0000 mg | ORAL_TABLET | Freq: Every day | ORAL | 3 refills | Status: DC

## 2017-02-13 NOTE — Patient Instructions (Addendum)
Use the Asmanex inhaler daily as prescribed and start back on the singulair an oral antihistamine such as Claritin, Allegra, Xyzal, Zyrtec. Use Flonase nasal spray as well to control allergy symptoms.  Use the albuterol inhaler as needed but if you are needing it more than twice per week during the day or once a month at bedtime then let me know.   Follow up in 5-6 months for a physical and asthma and allergies.

## 2017-02-13 NOTE — Progress Notes (Signed)
Subjective: Chief Complaint  Patient presents with  . asthma    asthma and allergies. having some SOB. started with the weather changing     Shannon Vaughan is a 41 y.o. female who presents for allergies and asthma. Symptoms include nasal congestion, rhinorrhea, clogged ears, itching watery eyes, scratchy throat.  Has been having dry cough, some wheezing and tightness in her chest.  She has not been seen here since August 2016.  Denies asthma attack or flare since last visit.   States she has been needing her albuterol inhaler and/or nebulizer twice daily for the past month due to allergies flaring up. Denies fever, chills, dizziness, chest pain, palpitations, shortness of breath, DOE, orthopnea, abdominal pain, N/V/D. No LE edema.   Prior to running out of her allergy and asthma medications she reports being asymptomatic and asthma well controlled.   Long history of asthma and seasonal allergies.  Triggers are seasonal allergies and exercise.  PFTs done in 2016 and mildly abnormal.   Treatment to date: antihistamines.  Denies sick contacts.  No other aggravating or relieving factors.  No other c/o.  LMP: one week ago. Tubal ligation.  Smoking 3-4 cigarettes daily.   ROS as in subjective.   Objective: Vitals:   02/13/17 0849  BP: 122/80  Pulse: 76  Resp: 18  Temp: 98.4 F (36.9 C)    General appearance: Alert, WD/WN, no distress, well- appearing                             Skin: warm, no rash                           Head: no sinus tenderness                            Eyes: conjunctiva normal, corneas clear, PERRLA                            Ears: pearly TMs, external ear canals normal                          Nose: septum midline, turbinates swollen, with erythema and clear discharge             Mouth/throat: MMM, tongue normal, mild pharyngeal erythema                           Neck: supple, no adenopathy, no thyromegaly, nontender                          Heart: RRR,  normal S1, S2, no murmurs                         Lungs: CTA bilaterally, mild expiratory wheezes to upper lung fields, no rales, or rhonchi      Assessment: Seasonal allergic rhinitis due to pollen  Asthma, chronic, moderate persistent, uncomplicated - Plan: montelukast (SINGULAIR) 10 MG tablet, mometasone (ASMANEX) 220 MCG/INH inhaler, albuterol (PROVENTIL HFA;VENTOLIN HFA) 108 (90 Base) MCG/ACT inhaler  Mild persistent chronic asthma without complication  Smoker    Plan: Discussed management and treatment of asthma and allergies. She is not in any distress and no sign  of infectious process.  Recommend maximizing control of allergies. Start Flonase, oral antihistamine such as Claritin, allegra, Zyrtec and start back on Singulair. She will start back on daily ICS as well. Refilled medications including albuterol inhaler and nebulizer with strict instructions to return if continuing to need these on a regular basis.  Smoking cessation counseling done.  Follow up in 6 months or sooner if needed. She will be due for a CPE as well.

## 2017-08-18 ENCOUNTER — Encounter: Payer: No Typology Code available for payment source | Admitting: Family Medicine

## 2017-08-28 NOTE — Progress Notes (Signed)
Subjective:    Patient ID: Shannon Vaughan, female    DOB: [birth year], 41 y.o.   MRN: [id number]  HPI Chief Complaint  Patient presents with  . physical    fasting cpe, pap done 2016- normal, eye exam done this year with new glasses,no other concern   She is here for a complete physical exam.  States she took Phentermine for 2 weeks 2 months ago. This was given to her by her mother. States she lost weight because this helped her jump start her metabolism.   States she has made healthy dietary changes and has not been keeping up the changes or exercising since stopping the phentermine.  Requests prescription of phentermine.  No other concerns.   Last CPE: 05/2015  Other providers: eyes- Dr. Shea Evansunn  Social history: Lives with husband and 3 kids, works as a Engineer, materialssecurity officer and going to school for early childhood associate degree.  Diet: loves carbohydrates.  Excerise: nothing   Immunizations: will get flu shot at work. Tdap up to date.   Health maintenance:  Mammogram: never  Colonoscopy: never  Last Pap smear- 05/2015. Normal and neg HPV. Due in 2019 Last Menstrual cycle: 08/02/2017 Last Dental Exam: overdue  Last Eye Exam: 10/2016 and normal per patient.   Wears seatbelt always, smoke detectors in home and functioning, does not text while driving and feels safe in home environment.   Reviewed allergies, medications, past medical, surgical, family, and social history.    Review of Systems Review of Systems Constitutional: -fever, -chills, -sweats, -unexpected weight change,-fatigue ENT: -runny nose, -ear pain, -sore throat Cardiology:  -chest pain, -palpitations, -edema Respiratory: -cough, -shortness of breath, -wheezing Gastroenterology: -abdominal pain, -nausea, -vomiting, -diarrhea, -constipation  Hematology: -bleeding or bruising problems Musculoskeletal: -arthralgias, -myalgias, -joint swelling, -back pain Ophthalmology: -vision changes Urology: -dysuria,  -difficulty urinating, -hematuria, -urinary frequency, -urgency Neurology: -headache, -weakness, -tingling, -numbness       Objective:   Physical Exam BP 122/80   Pulse 81   Ht 5\' 6"  (1.676 m)   Wt 197 lb 12.8 oz (89.7 kg)   LMP 08/02/2017   BMI 31.93 kg/m   General Appearance:    Alert, cooperative, no distress, appears stated age  Head:    Normocephalic, without obvious abnormality, atraumatic  Eyes:    PERRL, conjunctiva/corneas clear, EOM's intact, fundi    benign  Ears:    Normal TM's and external ear canals  Nose:   Nares normal, mucosa normal, no drainage or sinus   tenderness  Throat:   Lips, mucosa, and tongue normal; teeth and gums normal  Neck:   Supple, no lymphadenopathy;  thyroid: mild enlargement without tenderness/nodules; no carotid   bruit or JVD  Back:    Spine nontender, no curvature, ROM normal, no CVA     tenderness  Lungs:     Clear to auscultation bilaterally without wheezes, rales or     ronchi; respirations unlabored  Chest Wall:    No tenderness or deformity   Heart:    Regular rate and rhythm, S1 and S2 normal, no murmur, rub   or gallop  Breast Exam:    No tenderness, masses, or nipple discharge or inversion.      No axillary lymphadenopathy  Abdomen:     Soft, non-tender, nondistended, normoactive bowel sounds,    no masses, no hepatosplenomegaly  Genitalia:    Declines. Pap up to date.      Extremities:   No clubbing, cyanosis or edema  Pulses:  2+ and symmetric all extremities  Skin:   Skin color, texture, turgor normal, no rashes or lesions  Lymph nodes:   Cervical, supraclavicular, and axillary nodes normal  Neurologic:   CNII-XII intact, normal strength, sensation and gait; reflexes 2+ and symmetric throughout          Psych:   Normal mood, affect, hygiene and grooming.    Urinalysis dipstick: negative      Assessment & Plan:  Routine general medical examination at a health care facility - Plan: POCT Urinalysis DIP (Proadvantage  Device), CBC with Differential/Platelet, Comprehensive metabolic panel, TSH, Lipid panel  Obesity (BMI 30-39.9) - Plan: Comprehensive metabolic panel, phentermine 37.5 MG capsule  Screening for breast cancer - Plan: MM DIGITAL SCREENING BILATERAL  Enlarged thyroid - Plan: TSH, T3, T4, Free  Smoker  Advised her to not take other people's medication. Counseled on healthy lifestyle to lose weight long term and prescription given for a 1 month course of Phentermine. Discussed possible side effects.  Thyroid enlarged today and she reports a history of enlarged thyroid. Thyroid panel ordered.  Smoking cessation counseling done.  Mammogram ordered. This is her first mammogram.  Discussed safety and health promotion.  Follow up pending labs and in 4 weeks for weight and Phentermine use.

## 2017-08-29 ENCOUNTER — Encounter: Payer: Self-pay | Admitting: Family Medicine

## 2017-08-29 ENCOUNTER — Ambulatory Visit (INDEPENDENT_AMBULATORY_CARE_PROVIDER_SITE_OTHER): Payer: No Typology Code available for payment source | Admitting: Family Medicine

## 2017-08-29 VITALS — BP 122/80 | HR 81 | Ht 66.0 in | Wt 197.8 lb

## 2017-08-29 DIAGNOSIS — E669 Obesity, unspecified: Secondary | ICD-10-CM

## 2017-08-29 DIAGNOSIS — Z1231 Encounter for screening mammogram for malignant neoplasm of breast: Secondary | ICD-10-CM | POA: Diagnosis not present

## 2017-08-29 DIAGNOSIS — E049 Nontoxic goiter, unspecified: Secondary | ICD-10-CM

## 2017-08-29 DIAGNOSIS — Z0000 Encounter for general adult medical examination without abnormal findings: Secondary | ICD-10-CM

## 2017-08-29 DIAGNOSIS — F17200 Nicotine dependence, unspecified, uncomplicated: Secondary | ICD-10-CM | POA: Diagnosis not present

## 2017-08-29 DIAGNOSIS — Z1239 Encounter for other screening for malignant neoplasm of breast: Secondary | ICD-10-CM

## 2017-08-29 LAB — TSH: TSH: 1.47 mIU/L

## 2017-08-29 LAB — CBC WITH DIFFERENTIAL/PLATELET
Basophils Absolute: 38 cells/uL (ref 0–200)
Basophils Relative: 0.9 %
Eosinophils Absolute: 160 cells/uL (ref 15–500)
Eosinophils Relative: 3.8 %
HEMATOCRIT: 35.5 % (ref 35.0–45.0)
Hemoglobin: 11.6 g/dL — ABNORMAL LOW (ref 11.7–15.5)
LYMPHS ABS: 1852 {cells}/uL (ref 850–3900)
MCH: 27.4 pg (ref 27.0–33.0)
MCHC: 32.7 g/dL (ref 32.0–36.0)
MCV: 83.7 fL (ref 80.0–100.0)
MPV: 10.3 fL (ref 7.5–12.5)
Monocytes Relative: 6.6 %
NEUTROS ABS: 1873 {cells}/uL (ref 1500–7800)
Neutrophils Relative %: 44.6 %
Platelets: 282 10*3/uL (ref 140–400)
RBC: 4.24 10*6/uL (ref 3.80–5.10)
RDW: 13 % (ref 11.0–15.0)
Total Lymphocyte: 44.1 %
WBC mixed population: 277 cells/uL (ref 200–950)
WBC: 4.2 10*3/uL (ref 3.8–10.8)

## 2017-08-29 LAB — POCT URINALYSIS DIP (PROADVANTAGE DEVICE)
BILIRUBIN UA: NEGATIVE
Blood, UA: NEGATIVE
Glucose, UA: NEGATIVE mg/dL
Ketones, POC UA: NEGATIVE mg/dL
LEUKOCYTES UA: NEGATIVE
Nitrite, UA: NEGATIVE
Protein Ur, POC: NEGATIVE mg/dL
SPECIFIC GRAVITY, URINE: 1.03
Urobilinogen, Ur: NEGATIVE
pH, UA: 6 (ref 5.0–8.0)

## 2017-08-29 LAB — LIPID PANEL
CHOL/HDL RATIO: 2.3 (calc) (ref <5.0)
Cholesterol: 117 mg/dL (ref <200)
HDL: 50 mg/dL — AB (ref >50)
LDL Cholesterol (Calc): 57 mg/dL (calc)
NON-HDL CHOLESTEROL (CALC): 67 mg/dL (ref <130)
Triglycerides: 36 mg/dL (ref <150)

## 2017-08-29 LAB — COMPREHENSIVE METABOLIC PANEL
AG Ratio: 1.5 (calc) (ref 1.0–2.5)
ALBUMIN MSPROF: 4 g/dL (ref 3.6–5.1)
ALT: 8 U/L (ref 6–29)
AST: 14 U/L (ref 10–30)
Alkaline phosphatase (APISO): 53 U/L (ref 33–115)
BILIRUBIN TOTAL: 0.5 mg/dL (ref 0.2–1.2)
BUN: 12 mg/dL (ref 7–25)
CALCIUM: 9 mg/dL (ref 8.6–10.2)
CHLORIDE: 106 mmol/L (ref 98–110)
CO2: 26 mmol/L (ref 20–32)
Creat: 1.06 mg/dL (ref 0.50–1.10)
GLUCOSE: 77 mg/dL (ref 65–99)
Globulin: 2.7 g/dL (calc) (ref 1.9–3.7)
POTASSIUM: 4.3 mmol/L (ref 3.5–5.3)
SODIUM: 138 mmol/L (ref 135–146)
Total Protein: 6.7 g/dL (ref 6.1–8.1)

## 2017-08-29 LAB — T4, FREE: Free T4: 1.2 ng/dL (ref 0.8–1.8)

## 2017-08-29 LAB — T3: T3 TOTAL: 96 ng/dL (ref 76–181)

## 2017-08-29 MED ORDER — PHENTERMINE HCL 37.5 MG PO CAPS: 37.5000 mg | ORAL_CAPSULE | ORAL | 0 refills | Status: DC

## 2017-08-29 NOTE — Patient Instructions (Addendum)
As discussed, phentermine is a short term medication. The key to weight loss and keeping it off is to make healthy lifestyle changes that are life long. You can use a free app such as My Fitness Pal to start tracking calories and nutrition information. Cut back on carbohydrates and increase your physical activity.   Follow up with me in 4 weeks.   Call and schedule your mammogram.   Call your insurance company to find a dentist.   You can call and schedule your Dentist appointment at any of the following offices:   [facility] Address: [address]  [place], [place] [address] Phone #: [phone]  [facility] Address: [address] [place], [place] [address] Phone # [phone]  J. Hazeline Junker, DDS [facility]  Address: [address]                                                                 [place], [place] [address] Phone #: [phone]  Phentermine tablets or capsules What is this medicine? PHENTERMINE (FEN ter meen) decreases your appetite. It is used with a reduced calorie diet and exercise to help you lose weight. This medicine may be used for other purposes; ask your health care provider or pharmacist if you have questions. COMMON BRAND NAME(S): Adipex-P, Atti-Plex P, Atti-Plex P Spansule, Fastin, Lomaira, Pro-Fast, Tara-8 What should I tell my health care provider before I take this medicine? They need to know if you have any of these conditions: -agitation -glaucoma -heart disease -high blood pressure -history of substance abuse -lung disease called Primary Pulmonary Hypertension (PPH) -taken an MAOI like Carbex, Eldepryl, Marplan, Nardil, or Parnate in last 14 days -thyroid disease -an unusual or allergic reaction to phentermine, other medicines, foods, dyes, or preservatives -pregnant or trying to get pregnant -breast-feeding How should I use this medicine? Take this medicine by mouth with a  glass of water. Follow the directions on the prescription label. The instructions for use may differ based on the product and dose you are taking. Avoid taking this medicine in the evening. It may interfere with sleep. Take your doses at regular intervals. Do not take your medicine more often than directed. Talk to your pediatrician regarding the use of this medicine in children. While this drug may be prescribed for children 17 years or older for selected conditions, precautions do apply. Overdosage: If you think you have taken too much of this medicine contact a poison control center or emergency room at once. NOTE: This medicine is only for you. Do not share this medicine with others. What if I miss a dose? If you miss a dose, take it as soon as you can. If it is almost time for your next dose, take only that dose. Do not take double or extra doses. What may interact with this medicine? Do not take this medicine with any of the following medications: -duloxetine -MAOIs like Carbex, Eldepryl, Marplan, Nardil, and Parnate -medicines for colds or breathing difficulties like pseudoephedrine or phenylephrine -procarbazine -sibutramine -SSRIs like citalopram, escitalopram, fluoxetine, fluvoxamine, paroxetine, and sertraline -stimulants like dexmethylphenidate, methylphenidate or modafinil -venlafaxine This medicine may also interact with the following medications: -medicines for diabetes This list may not describe all possible interactions. Give  your health care provider a list of all the medicines, herbs, non-prescription drugs, or dietary supplements you use. Also tell them if you smoke, drink alcohol, or use illegal drugs. Some items may interact with your medicine. What should I watch for while using this medicine? Notify your physician immediately if you become short of breath while doing your normal activities. Do not take this medicine within 6 hours of bedtime. It can keep you from getting  to sleep. Avoid drinks that contain caffeine and try to stick to a regular bedtime every night. This medicine was intended to be used in addition to a healthy diet and exercise. The best results are achieved this way. This medicine is only indicated for short-term use. Eventually your weight loss may level out. At that point, the drug will only help you maintain your new weight. Do not increase or in any way change your dose without consulting your doctor. You may get drowsy or dizzy. Do not drive, use machinery, or do anything that needs mental alertness until you know how this medicine affects you. Do not stand or sit up quickly, especially if you are an older patient. This reduces the risk of dizzy or fainting spells. Alcohol may increase dizziness and drowsiness. Avoid alcoholic drinks. What side effects may I notice from receiving this medicine? Side effects that you should report to your doctor or health care professional as soon as possible: -chest pain, palpitations -depression or severe changes in mood -increased blood pressure -irritability -nervousness or restlessness -severe dizziness -shortness of breath -problems urinating -unusual swelling of the legs -vomiting Side effects that usually do not require medical attention (report to your doctor or health care professional if they continue or are bothersome): -blurred vision or other eye problems -changes in sexual ability or desire -constipation or diarrhea -difficulty sleeping -dry mouth or unpleasant taste -headache -nausea This list may not describe all possible side effects. Call your doctor for medical advice about side effects. You may report side effects to FDA at 1-800-FDA-1088. Where should I keep my medicine? Keep out of the reach of children. This medicine can be abused. Keep your medicine in a safe place to protect it from theft. Do not share this medicine with anyone. Selling or giving away this medicine is dangerous  and against the law. This medicine may cause accidental overdose and death if taken by other adults, children, or pets. Mix any unused medicine with a substance like cat litter or coffee grounds. Then throw the medicine away in a sealed container like a sealed bag or a coffee can with a lid. Do not use the medicine after the expiration date. Store at room temperature between 20 and 25 degrees C (68 and 77 degrees F). Keep container tightly closed. NOTE: This sheet is a summary. It may not cover all possible information. If you have questions about this medicine, talk to your doctor, pharmacist, or health care provider.  2018 Elsevier/Gold Standard (2015-07-07 12:53:15)  Preventative Care for Adults - Female      MAINTAIN REGULAR HEALTH EXAMS:  A routine yearly physical is a good way to check in with your primary care provider about your health and preventive screening. It is also an opportunity to share updates about your health and any concerns you have, and receive a thorough all-over exam.   Most health insurance companies pay for at least some preventative services.  Check with your health plan for specific coverages.  WHAT PREVENTATIVE SERVICES DO WOMEN NEED?  Adult women should have their weight and blood pressure checked regularly.   Women age 65 and older should have their cholesterol levels checked regularly.  Women should be screened for cervical cancer with a Pap smear and pelvic exam beginning at either age 40, or 3 years after they become sexually activity.    Breast cancer screening generally begins at age 18 with a mammogram and breast exam by your primary care provider.    Beginning at age 65 and continuing to age 50, women should be screened for colorectal cancer.  Certain people may need continued testing until age 12.  Updating vaccinations is part of preventative care.  Vaccinations help protect against diseases such as the flu.  Osteoporosis is a disease in which the  bones lose minerals and strength as we age. Women ages 60 and over should discuss this with their caregivers, as should women after menopause who have other risk factors.  Lab tests are generally done as part of preventative care to screen for anemia and blood disorders, to screen for problems with the kidneys and liver, to screen for bladder problems, to check blood sugar, and to check your cholesterol level.  Preventative services generally include counseling about diet, exercise, avoiding tobacco, drugs, excessive alcohol consumption, and sexually transmitted infections.    GENERAL RECOMMENDATIONS FOR GOOD HEALTH:  Healthy diet:  Eat a variety of foods, including fruit, vegetables, animal or vegetable protein, such as meat, fish, chicken, and eggs, or beans, lentils, tofu, and grains, such as rice.  Drink plenty of water daily.  Decrease saturated fat in the diet, avoid lots of red meat, processed foods, sweets, fast foods, and fried foods.  Exercise:  Aerobic exercise helps maintain good heart health. At least 30-40 minutes of moderate-intensity exercise is recommended. For example, a brisk walk that increases your heart rate and breathing. This should be done on most days of the week.   Find a type of exercise or a variety of exercises that you enjoy so that it becomes a part of your daily life.  Examples are running, walking, swimming, water aerobics, and biking.  For motivation and support, explore group exercise such as aerobic class, spin class, Zumba, Yoga,or  martial arts, etc.    Set exercise goals for yourself, such as a certain weight goal, walk or run in a race such as a 5k walk/run.  Speak to your primary care provider about exercise goals.  Disease prevention:  If you smoke or chew tobacco, find out from your caregiver how to quit. It can literally save your life, no matter how long you have been a tobacco user. If you do not use tobacco, never begin.   Maintain a healthy  diet and normal weight. Increased weight leads to problems with blood pressure and diabetes.   The Body Mass Index or BMI is a way of measuring how much of your body is fat. Having a BMI above 27 increases the risk of heart disease, diabetes, hypertension, stroke and other problems related to obesity. Your caregiver can help determine your BMI and based on it develop an exercise and dietary program to help you achieve or maintain this important measurement at a healthful level.  High blood pressure causes heart and blood vessel problems.  Persistent high blood pressure should be treated with medicine if weight loss and exercise do not work.   Fat and cholesterol leaves deposits in your arteries that can block them. This causes heart disease and vessel disease elsewhere  in your body.  If your cholesterol is found to be high, or if you have heart disease or certain other medical conditions, then you may need to have your cholesterol monitored frequently and be treated with medication.   Ask if you should have a cardiac stress test if your history suggests this. A stress test is a test done on a treadmill that looks for heart disease. This test can find disease prior to there being a problem.  Menopause can be associated with physical symptoms and risks. Hormone replacement therapy is available to decrease these. You should talk to your caregiver about whether starting or continuing to take hormones is right for you.   Osteoporosis is a disease in which the bones lose minerals and strength as we age. This can result in serious bone fractures. Risk of osteoporosis can be identified using a bone density scan. Women ages 49 and over should discuss this with their caregivers, as should women after menopause who have other risk factors. Ask your caregiver whether you should be taking a calcium supplement and Vitamin D, to reduce the rate of osteoporosis.   Avoid drinking alcohol in excess (more than two drinks  per day).  Avoid use of street drugs. Do not share needles with anyone. Ask for professional help if you need assistance or instructions on stopping the use of alcohol, cigarettes, and/or drugs.  Brush your teeth twice a day with fluoride toothpaste, and floss once a day. Good oral hygiene prevents tooth decay and gum disease. The problems can be painful, unattractive, and can cause other health problems. Visit your dentist for a routine oral and dental check up and preventive care every 6-12 months.   Look at your skin regularly.  Use a mirror to look at your back. Notify your caregivers of changes in moles, especially if there are changes in shapes, colors, a size larger than a pencil eraser, an irregular border, or development of new moles.  Safety:  Use seatbelts 100% of the time, whether driving or as a passenger.  Use safety devices such as hearing protection if you work in environments with loud noise or significant background noise.  Use safety glasses when doing any work that could send debris in to the eyes.  Use a helmet if you ride a bike or motorcycle.  Use appropriate safety gear for contact sports.  Talk to your caregiver about gun safety.  Use sunscreen with a SPF (or skin protection factor) of 15 or greater.  Lighter skinned people are at a greater risk of skin cancer. Don't forget to also wear sunglasses in order to protect your eyes from too much damaging sunlight. Damaging sunlight can accelerate cataract formation.   Practice safe sex. Use condoms. Condoms are used for birth control and to help reduce the spread of sexually transmitted infections (or STIs).  Some of the STIs are gonorrhea (the clap), chlamydia, syphilis, trichomonas, herpes, HPV (human papilloma virus) and HIV (human immunodeficiency virus) which causes AIDS. The herpes, HIV and HPV are viral illnesses that have no cure. These can result in disability, cancer and death.   Keep carbon monoxide and smoke detectors in  your home functioning at all times. Change the batteries every 6 months or use a model that plugs into the wall.   Vaccinations:  Stay up to date with your tetanus shots and other required immunizations. You should have a booster for tetanus every 10 years. Be sure to get your flu shot every year, since  5%-20% of the [place] population comes down with the flu. The flu vaccine changes each year, so being vaccinated once is not enough. Get your shot in the fall, before the flu season peaks.   Other vaccines to consider:  Human Papilloma Virus or HPV causes cancer of the cervix, and other infections that can be transmitted from person to person. There is a vaccine for HPV, and females should get immunized between the ages of 2211 and 3326. It requires a series of 3 shots.   Pneumococcal vaccine to protect against certain types of pneumonia.  This is normally recommended for adults age 40 or older.  However, adults younger than 40 years old with certain underlying conditions such as diabetes, heart or lung disease should also receive the vaccine.  Shingles vaccine to protect against Varicella Zoster if you are older than age 40, or younger than 40 years old with certain underlying illness.  Hepatitis A vaccine to protect against a form of infection of the liver by a virus acquired from food.  Hepatitis B vaccine to protect against a form of infection of the liver by a virus acquired from blood or body fluids, particularly if you work in health care.  If you plan to travel internationally, check with your local health department for specific vaccination recommendations.  Cancer Screening:  Breast cancer screening is essential to preventive care for women. All women age 40 and older should perform a breast self-exam every month. At age 40 and older, women should have their caregiver complete a breast exam each year. Women at ages 440 and older should have a mammogram (x-ray film) of the breasts. Your  caregiver can discuss how often you need mammograms.    Cervical cancer screening includes taking a Pap smear (sample of cells examined under a microscope) from the cervix (end of the uterus). It also includes testing for HPV (Human Papilloma Virus, which can cause cervical cancer). Screening and a pelvic exam should begin at age 40, or 3 years after a woman becomes sexually active. Screening should occur every year, with a Pap smear but no HPV testing, up to age 40. After age 40, you should have a Pap smear every 3 years with HPV testing, if no HPV was found previously.   Most routine colon cancer screening begins at the age of 40. On a yearly basis, doctors may provide special easy to use take-home tests to check for hidden blood in the stool. Sigmoidoscopy or colonoscopy can detect the earliest forms of colon cancer and is life saving. These tests use a small camera at the end of a tube to directly examine the colon. Speak to your caregiver about this at age 40, when routine screening begins (and is repeated every 5 years unless early forms of pre-cancerous polyps or small growths are found).

## 2017-09-29 ENCOUNTER — Ambulatory Visit: Payer: No Typology Code available for payment source | Admitting: Family Medicine

## 2017-09-30 ENCOUNTER — Ambulatory Visit
Admission: RE | Admit: 2017-09-30 | Discharge: 2017-09-30 | Disposition: A | Discharge status: Home / Self Care | Payer: PRIVATE HEALTH INSURANCE | Source: Ambulatory Visit | Attending: Family Medicine | Admitting: Family Medicine

## 2017-09-30 DIAGNOSIS — Z1239 Encounter for other screening for malignant neoplasm of breast: Secondary | ICD-10-CM

## 2018-02-18 ENCOUNTER — Other Ambulatory Visit: Payer: Self-pay | Admitting: Family Medicine

## 2018-02-18 DIAGNOSIS — J4540 Moderate persistent asthma, uncomplicated: Secondary | ICD-10-CM

## 2018-02-18 NOTE — Telephone Encounter (Signed)
Is this okay to refill? 

## 2018-02-18 NOTE — Telephone Encounter (Signed)
Left message for pt to call me back 

## 2018-02-18 NOTE — Telephone Encounter (Signed)
Ok to refill but please ask her how often she is needing it and if she has had any asthma flares recently. Have her come in if allergies or asthma is not well controlled.

## 2018-02-20 NOTE — Telephone Encounter (Signed)
Patient called back and states that she takes BID and that this medication is calming her symptoms down.

## 2018-02-20 NOTE — Telephone Encounter (Signed)
Pt states she is having to use her proair everyday for the past 3 weeks but not working so she had had to use her albuterol inhaler to calm her flare up down. Pt was advised she would need an appt if her med is not working to discuss this.

## 2018-02-25 ENCOUNTER — Ambulatory Visit: Payer: No Typology Code available for payment source | Admitting: Family Medicine

## 2018-02-27 ENCOUNTER — Ambulatory Visit: Payer: No Typology Code available for payment source | Admitting: Family Medicine

## 2018-04-25 ENCOUNTER — Emergency Department (HOSPITAL_COMMUNITY): Payer: PRIVATE HEALTH INSURANCE

## 2018-04-25 ENCOUNTER — Encounter (HOSPITAL_COMMUNITY): Payer: Self-pay | Admitting: Emergency Medicine

## 2018-04-25 ENCOUNTER — Other Ambulatory Visit: Payer: Self-pay

## 2018-04-25 ENCOUNTER — Emergency Department (HOSPITAL_COMMUNITY)
Admission: EM | Admit: 2018-04-25 | Discharge: 2018-04-25 | Disposition: A | Discharge status: Home / Self Care | Payer: PRIVATE HEALTH INSURANCE | Attending: Emergency Medicine | Admitting: Emergency Medicine

## 2018-04-25 DIAGNOSIS — Y999 Unspecified external cause status: Secondary | ICD-10-CM | POA: Insufficient documentation

## 2018-04-25 DIAGNOSIS — Y9351 Activity, roller skating (inline) and skateboarding: Secondary | ICD-10-CM | POA: Insufficient documentation

## 2018-04-25 DIAGNOSIS — W010XXA Fall on same level from slipping, tripping and stumbling without subsequent striking against object, initial encounter: Secondary | ICD-10-CM | POA: Insufficient documentation

## 2018-04-25 DIAGNOSIS — Y929 Unspecified place or not applicable: Secondary | ICD-10-CM | POA: Insufficient documentation

## 2018-04-25 DIAGNOSIS — S82852A Displaced trimalleolar fracture of left lower leg, initial encounter for closed fracture: Secondary | ICD-10-CM | POA: Insufficient documentation

## 2018-04-25 DIAGNOSIS — W19XXXA Unspecified fall, initial encounter: Secondary | ICD-10-CM

## 2018-04-25 DIAGNOSIS — R52 Pain, unspecified: Secondary | ICD-10-CM

## 2018-04-25 LAB — I-STAT CHEM 8, ED
BUN: 17 mg/dL (ref 6–20)
CALCIUM ION: 1.23 mmol/L (ref 1.15–1.40)
CREATININE: 1.2 mg/dL — AB (ref 0.44–1.00)
Chloride: 106 mmol/L (ref 98–111)
GLUCOSE: 131 mg/dL — AB (ref 70–99)
HEMATOCRIT: 38 % (ref 36.0–46.0)
HEMOGLOBIN: 12.9 g/dL (ref 12.0–15.0)
Potassium: 3.6 mmol/L (ref 3.5–5.1)
Sodium: 141 mmol/L (ref 135–145)
TCO2: 23 mmol/L (ref 22–32)

## 2018-04-25 LAB — CBC WITH DIFFERENTIAL/PLATELET
Abs Immature Granulocytes: 0 10*3/uL (ref 0.0–0.1)
Basophils Absolute: 0.1 10*3/uL (ref 0.0–0.1)
Basophils Relative: 1 %
EOS ABS: 0.1 10*3/uL (ref 0.0–0.7)
EOS PCT: 2 %
HEMATOCRIT: 35.1 % — AB (ref 36.0–46.0)
HEMOGLOBIN: 11.1 g/dL — AB (ref 12.0–15.0)
Immature Granulocytes: 0 %
LYMPHS ABS: 2.7 10*3/uL (ref 0.7–4.0)
Lymphocytes Relative: 37 %
MCH: 27.3 pg (ref 26.0–34.0)
MCHC: 31.6 g/dL (ref 30.0–36.0)
MCV: 86.5 fL (ref 78.0–100.0)
MONO ABS: 0.4 10*3/uL (ref 0.1–1.0)
Monocytes Relative: 6 %
Neutro Abs: 3.9 10*3/uL (ref 1.7–7.7)
Neutrophils Relative %: 54 %
Platelets: 223 10*3/uL (ref 150–400)
RBC: 4.06 MIL/uL (ref 3.87–5.11)
RDW: 14.5 % (ref 11.5–15.5)
WBC: 7.2 10*3/uL (ref 4.0–10.5)

## 2018-04-25 LAB — I-STAT BETA HCG BLOOD, ED (MC, WL, AP ONLY): I-stat hCG, quantitative: <5 m[IU]/mL (ref <5)

## 2018-04-25 MED ORDER — KETAMINE HCL 50 MG/5ML IJ SOSY
0.5000 mg/kg | PREFILLED_SYRINGE | Freq: Once | INTRAMUSCULAR | Status: AC
  Administered 2018-04-25: 46 mg via INTRAVENOUS
  Filled 2018-04-25 (×3): qty 5

## 2018-04-25 MED ORDER — OXYCODONE-ACETAMINOPHEN 5-325 MG PO TABS
1.0000 | ORAL_TABLET | Freq: Once | ORAL | Status: AC
  Administered 2018-04-25: 1 via ORAL
  Filled 2018-04-25: qty 1

## 2018-04-25 MED ORDER — SODIUM CHLORIDE 0.9 % IV BOLUS
1000.0000 mL | Freq: Once | INTRAVENOUS | Status: AC
  Administered 2018-04-25: 1000 mL via INTRAVENOUS

## 2018-04-25 MED ORDER — MIDAZOLAM HCL 2 MG/2ML IJ SOLN
4.0000 mg | Freq: Once | INTRAMUSCULAR | Status: AC
  Administered 2018-04-25: 4 mg via INTRAVENOUS
  Filled 2018-04-25: qty 4

## 2018-04-25 MED ORDER — FENTANYL CITRATE (PF) 100 MCG/2ML IJ SOLN
100.0000 ug | Freq: Once | INTRAMUSCULAR | Status: AC
  Administered 2018-04-25: 100 ug via RESPIRATORY_TRACT
  Filled 2018-04-25: qty 2

## 2018-04-25 MED ORDER — OXYCODONE-ACETAMINOPHEN 5-325 MG PO TABS: 1.0000 | ORAL_TABLET | Freq: Four times a day (QID) | ORAL | 0 refills | Status: DC | PRN

## 2018-04-25 NOTE — ED Notes (Signed)
Pain med given 

## 2018-04-25 NOTE — Sedation Documentation (Signed)
Post  Reduction lt ankle xrays portable

## 2018-04-25 NOTE — Progress Notes (Signed)
Orthopedic Tech Progress Note Patient Details:  Shannon BeckmannQuanna L Vaughan 1976-01-20 [id number]  Ortho Devices Type of Ortho Device: Post (short leg) splint, Stirrup splint Ortho Device/Splint Location: lle. applied at drs request. with drs assistance. Ortho Device/Splint Interventions: Ordered, Application   Post Interventions Patient Tolerated: Well Instructions Provided: Care of device, Adjustment of device   [name], Terese Heier J 04/25/2018, 8:23 PM

## 2018-04-25 NOTE — ED Triage Notes (Signed)
Pt states that she was roller skating and she fell on her left ankle and heard a pop. Visible deformity, swelling, and bruising noted to left ankle

## 2018-04-25 NOTE — Discharge Instructions (Addendum)
Please do not bear weight on affected ankle.  Use crutches.  Take pain medication as prescribed.  Call and follow up with orthopedist next week for further care.  Remember to elevate your left ankle above your heart when resting.

## 2018-04-25 NOTE — Sedation Documentation (Signed)
reaDY to start medication for procedure

## 2018-04-25 NOTE — ED Notes (Signed)
Pt returned from c-t  Pt alert oriented  Skin warm and dry

## 2018-04-25 NOTE — ED Notes (Signed)
Pt to ct 

## 2018-04-25 NOTE — Sedation Documentation (Signed)
Pt awake talking to the pts family

## 2018-04-25 NOTE — ED Provider Notes (Signed)
[facility] EMERGENCY DEPARTMENT Provider Note   CSN: [id number] Arrival date & time: 04/25/18  1807     History   Chief Complaint Chief Complaint  Patient presents with  . Ankle Injury    HPI Shannon Vaughan is a 42 y.o. female.  The history is provided by the patient. No language interpreter was used.  Ankle Injury     42 year old female presenting for evaluation of left ankle injury.  Patient reports she was rollerskating prior to arrival, and while she was trying to catch her daughter, she fell and her left foot went backward.  She denies hitting her head or loss of consciousness but report acute onset of severe sharp pain about her L ankle.  She heard a pop and noticed an obvious deformity to her ankle.  She denies striking her head or LOC.  Denies any other pain or injury.  She last ate pizza 1 hr ago.  Pain is sharp, severe, nonradiating, unable to bear weight without any numbness.  Past Medical History:  Diagnosis Date  . Asthma   . Eczema     Patient Active Problem List   Diagnosis Date Noted  . Obesity 06/06/2015  . Asthma, chronic 05/23/2015  . Eczema 05/23/2015  . Leg cramping 05/23/2015  . Seasonal allergies 05/23/2015  . Smoker 05/23/2015    Past Surgical History:  Procedure Laterality Date  . CESAREAN SECTION    . TUBAL LIGATION       OB History    Gravida  5   Para  3   Term  2   Preterm  1   AB  2   Living  3     SAB  1   TAB  1   Ectopic      Multiple      Live Births  1            Home Medications    Prior to Admission medications   Medication Sig Start Date End Date Taking? Authorizing Provider  albuterol (PROVENTIL HFA;VENTOLIN HFA) 108 (90 Base) MCG/ACT inhaler INHALE 2 PUFFS INTO THE LUNGS EVERY 6 HOURS AS NEEDED FOR WHEEZING OR SHORTNESS OF BREATH 02/20/18   Henson, Vickie L, NP-C  albuterol (PROVENTIL) (2.5 MG/3ML) 0.083% nebulizer solution Take 3 mLs (2.5 mg total) by nebulization every 6  (six) hours as needed for wheezing or shortness of breath. 02/13/17   Henson, Vickie L, NP-C  fluticasone (FLONASE) 50 MCG/ACT nasal spray Place 2 sprays into both nostrils daily. 02/13/17   Henson, Vickie L, NP-C  mometasone (ASMANEX) 220 MCG/INH inhaler Inhale 2 puffs into the lungs daily. 02/13/17   Henson, Vickie L, NP-C  montelukast (SINGULAIR) 10 MG tablet Take 1 tablet (10 mg total) by mouth at bedtime. 02/13/17   Henson, Vickie L, NP-C  phentermine 37.5 MG capsule Take 1 capsule (37.5 mg total) every morning by mouth. 08/29/17   Avanell Shackleton, NP-C    Family History Family History  Problem Relation Age of Onset  . Hypertension Mother   . Asthma Mother   . Asthma Sister   . Asthma Brother   . Hypertension Maternal Grandmother   . Diabetes Maternal Grandmother   . Hypertension Paternal Grandmother   . Diabetes Paternal Grandmother   . Hypertension Maternal Grandfather   . Hypertension Paternal Grandfather   . Other Neg Hx     Social History Social History   Tobacco Use  . Smoking status: Current Some Day Smoker  Packs/day: 0.25    Types: Cigarettes  . Smokeless tobacco: Never Used  Substance Use Topics  . Alcohol use: Yes    Comment: 2-3 glasses of wine per week  . Drug use: No     Allergies   Penicillins and Aspirin   Review of Systems Review of Systems  Musculoskeletal: Positive for joint swelling.  Skin: Negative for wound.  All other systems reviewed and are negative.    Physical Exam Updated Vital Signs BP 105/83 (BP Location: Left Arm)   Pulse 68   Temp 98.1 F (36.7 C) (Oral)   Resp (!) 22   Ht 5\' 7"  (1.702 m)   Wt 91.6 kg (202 lb)   SpO2 99%   BMI 31.64 kg/m   Physical Exam  Constitutional: She appears well-developed and well-nourished. No distress.  Obese female, tearful in moderate distress.  HENT:  Head: Atraumatic.  Eyes: Conjunctivae are normal.  Neck: Neck supple.  Cardiovascular: Normal rate and regular rhythm.  Pulmonary/Chest:  Breath sounds normal.  Abdominal: Soft. Bowel sounds are normal.  Musculoskeletal: She exhibits deformity (Left ankle: Obvious closed deformity with left foot deviated laterally, exquisite tenderness to palpation with decreased range of motion, pedal pulse palpable with brisk cap refill.  No tenderness to fifth metatarsal region.).  Neurological: She is alert.  Skin: No rash noted.  Psychiatric: She has a normal mood and affect.  Nursing note and vitals reviewed.    ED Treatments / Results  Labs (all labs ordered are listed, but only abnormal results are displayed) Labs Reviewed  CBC WITH DIFFERENTIAL/PLATELET - Abnormal; Notable for the following components:      Result Value   Hemoglobin 11.1 (*)    HCT 35.1 (*)    All other components within normal limits  I-STAT CHEM 8, ED - Abnormal; Notable for the following components:   Creatinine, Ser 1.20 (*)    Glucose, Bld 131 (*)    All other components within normal limits  I-STAT BETA HCG BLOOD, ED ([facility], [facility], [facility] ONLY)    EKG None  Radiology Ct Ankle Left Wo Contrast  Result Date: 04/25/2018 CLINICAL DATA:  Left ankle fracture/dislocation post closed reduction. EXAM: CT OF THE LEFT ANKLE WITHOUT CONTRAST TECHNIQUE: Multidetector CT imaging of the left ankle was performed according to the standard protocol. Multiplanar CT image reconstructions were also generated. COMPARISON:  Radiographs same date. FINDINGS: Bones/Joint/Cartilage The ankle is splinted. The transverse fracture through the base the medial malleolus is mildly comminuted with 8 mm of peripheral distraction. There is a comminuted, mildly depressed and displaced fracture of the tibial plafond posteriorly. This results in up to 7 mm distraction along the articular surface laterally (image 31/3). There is less than 2 mm of depression of the articular surface. The oblique fracture of the distal fibular diaphysis demonstrates up to 7 mm of posterior displacement. There is posterior  displacement of linear fracture fragments which measure up to 3 cm in overall length. This fractured extends distally into the distal tibiofibular joint. There is resulting mild widening of the ankle mortise and mild residual posterior and lateral subluxation of the talus relative to the tibial plafond. The talar dome is intact. The subtalar joint and calcaneus are intact. No tarsal bone fractures are seen. Small type 1 accessory navicular and os peroneum are noted. Ligaments Suboptimally assessed by CT. Muscles and Tendons The ankle tendons are grossly intact and normally located. No entrapment of the medial flexor tendons in the posterior malleolar fracture identified. Soft  tissues Mild soft tissue swelling around the ankle. No significant focal hematoma. IMPRESSION: 1. Trimalleolar fracture of the left ankle with improved alignment compared with original radiographs. There is residual displacement and mild depression of the articular surface of the tibial plafond posteriorly. 2. Mild residual posterolateral subluxation of the talus relative to the tibial plafond. 3. No evidence of tarsal bone fracture. Electronically Signed   By: Carey Bullocks M.D.   On: 04/25/2018 21:00   Dg Tibia/fibula Left Port  Result Date: 04/25/2018 CLINICAL DATA:  Pt states that she was roller skating and she fell on her left ankle and heard a pop. Visible deformity, swelling, and bruising noted to left ankle EXAM: PORTABLE LEFT TIBIA AND FIBULA - 2 VIEW COMPARISON:  None. FINDINGS: Displaced trimalleolar fractures of ankle with dislocation of the talus as described under the left ankle radiographs. No other fractures. No bone lesions. Knee joint is normally spaced and aligned. There is ankle soft tissue swelling. IMPRESSION: 1. Displaced trimalleolar fracture ankle with talar dislocation. 2. No other fractures.  Well-aligned knee joint. Electronically Signed   By: Amie Portland M.D.   On: 04/25/2018 18:52   Dg Ankle Left  Port  Result Date: 04/25/2018 CLINICAL DATA:  Ankle fracture dislocation post closed reduction. EXAM: PORTABLE LEFT ANKLE - 2 VIEW COMPARISON:  Radiographs earlier the same date. FINDINGS: A splint has been applied. There is near anatomic reduction of the transverse fracture involving the base of the medial malleolus. There is no residual posterolateral dislocation of the talus. There is improved alignment of the posterior malleolar and distal fibular fractures. The fibular diaphyseal fracture demonstrates no significant residual angulation, although there is some residual posteromedial displacement of a linear fracture fragment. No evidence of tarsal bone fracture. IMPRESSION: Reduced dislocation and improved alignment of the left ankle trimalleolar fractures. Electronically Signed   By: Carey Bullocks M.D.   On: 04/25/2018 19:53   Dg Ankle Left Port  Result Date: 04/25/2018 CLINICAL DATA:  Pt states that she was roller skating and she fell on her left ankle and heard a pop. Visible deformity, swelling, and bruising noted to left ankle EXAM: PORTABLE LEFT ANKLE - 2 VIEW COMPARISON:  None. FINDINGS: There is a fracture dislocation of the left ankle. There is a displaced fracture across the base of the medial malleolus and oblique displaced mildly comminuted fracture of the distal fibula. There is also fracture of the posterior malleolus. The talus has dislocated posteriorly and laterally in relation to the tibia. There is diffuse surrounding soft tissue edema. IMPRESSION: 1. Displaced, trimalleolar left ankle fracture with dislocation of the talus posteriorly and laterally. Electronically Signed   By: Amie Portland M.D.   On: 04/25/2018 18:50   Dg Foot Complete Left  Result Date: 04/25/2018 CLINICAL DATA:  Pt states that she was roller skating and she fell on her left ankle and heard a pop. Visible deformity, swelling, and bruising noted to left ankle EXAM: LEFT FOOT - COMPLETE 3+ VIEW COMPARISON:  None.  FINDINGS: Left ankle fractures with talar dislocation as described under the left ankle radiographs. No left foot fracture. No bone lesion. Foot joints are normally spaced and aligned. IMPRESSION: 1. Left ankle fractures with talar dislocation. Please refer to the left ankle radiographs. 2. No left foot fracture or dislocation. Electronically Signed   By: Amie Portland M.D.   On: 04/25/2018 18:51    Procedures .Ortho Injury Treatment Date/Time: 04/25/2018 7:46 PM Performed by: Fayrene Helper, PA-C Authorized by: Fayrene Helper,  PA-C   Consent:    Consent obtained:  Written   Consent given by:  Patient   Risks discussed:  Fracture, irreducible dislocation and vascular damage   Alternatives discussed:  Referral, delayed treatment and immobilization Universal protocol:    Procedure explained and questions answered to patient or proxy's satisfaction: yes     Relevant documents present and verified: yes     Imaging studies available: yes     Site/side marked: yes     Immediately prior to procedure a time out was called: yes     Patient identity confirmed:  Verbally with patient and arm bandInjury location: ankle Location details: left ankle Injury type: fracture-dislocation Fracture type: trimalleolar Pre-procedure neurovascular assessment: neurovascularly intact Pre-procedure distal perfusion: normal Pre-procedure neurological function: normal Pre-procedure range of motion: reduced  Anesthesia: Local anesthesia used: no  Patient sedated: Yes. Refer to sedation procedure documentation for details of sedation. Manipulation performed: yes Skin traction used: no Skeletal traction used: no Reduction successful: yes X-ray confirmed reduction: yes Immobilization: splint and crutches Splint type: long leg Supplies used: Ortho-Glass Post-procedure neurovascular assessment: post-procedure neurovascularly intact Post-procedure distal perfusion: normal Post-procedure neurological function:  normal Post-procedure range of motion: improved Patient tolerance: Patient tolerated the procedure well with no immediate complications    (including critical care time)  SPLINT APPLICATION Date/Time: 9:15 PM Authorized by: Fayrene Helper Consent: Verbal consent obtained. Risks and benefits: risks, benefits and alternatives were discussed Consent given by: patient Splint applied by: orthopedic technician Location details: L ankle Splint type: posterior long leg Supplies used: ortho-glass Post-procedure: The splinted body part was neurovascularly unchanged following the procedure. Patient tolerance: Patient tolerated the procedure well with no immediate complications.     Medications Ordered in ED Medications  fentaNYL (SUBLIMAZE) injection 100 mcg (100 mcg Inhalation Given 04/25/18 1833)  sodium chloride 0.9 % bolus 1,000 mL (1,000 mLs Intravenous New Bag/Given 04/25/18 1850)  midazolam (VERSED) injection 4 mg (4 mg Intravenous Given 04/25/18 1924)  ketamine 50 mg in normal saline 5 mL (10 mg/mL) syringe (46 mg Intravenous Given 04/25/18 1928)  oxyCODONE-acetaminophen (PERCOCET/ROXICET) 5-325 MG per tablet 1 tablet (1 tablet Oral Given 04/25/18 2042)     Initial Impression / Assessment and Plan / ED Course  I have reviewed the triage vital signs and the nursing notes.  Pertinent labs & imaging results that were available during my care of the patient were reviewed by me and considered in my medical decision making (see chart for details).     BP 136/75   Pulse 67   Temp 98.1 F (36.7 C) (Oral)   Resp 20   Ht 5\' 7"  (1.702 m)   Wt 91.6 kg (202 lb)   SpO2 100%   BMI 31.64 kg/m    Final Clinical Impressions(s) / ED Diagnoses   Final diagnoses:  Fall  Closed displaced trimalleolar fracture of left ankle, initial encounter    ED Discharge Orders        Ordered    oxyCODONE-acetaminophen (PERCOCET) 5-325 MG tablet  Every 6 hours PRN     04/25/18 2118     6:47  PM Patient injured her left ankle approximately 1 hour ago when she lost balance and fell while rollerskating.  She suffered a closed fracture dislocation involving her left ankle which will benefit from reduction.  She is neurovascular intact.  She last ate 1 hour ago.  Care discussed with DR. Yelverton.  Plan to perform procedural sedation follows with reduction of L ankle and  splinting.    6:56 PM Appreciate consultation from oncall orthopedist Dr. Aundria Rudogers who request procedural reduction and post reduction xray.  As well as splinting.  Pt can f/u in office next week.   7:49 PM Dr. Aundria Rudogers requested for left ankle CT prior to discharge.   9:14 PM CT done.  Pt d/c home with crutches, work note, pain medication and outpt f/u .Marland Kitchen. Recommend elevation of ankle above heart when resting, non weightbearing. In order to decrease risk of narcotic abuse. Pt's record were checked using the [place] Controlled Substance database.     Fayrene Helperran, Sheera Illingworth, PA-C 04/25/18 2119    Loren RacerYelverton, David, MD 04/26/18 [phone]

## 2018-04-25 NOTE — ED Notes (Signed)
Got patient on the bed into a gown on the monitor patient is resting with family and nurse at bedside

## 2018-04-25 NOTE — ED Notes (Signed)
Deformity of the lt ankle good pedal pulse color good pt in extreme pain especially with movement

## 2018-04-29 ENCOUNTER — Other Ambulatory Visit: Payer: Self-pay

## 2018-04-29 ENCOUNTER — Encounter (HOSPITAL_BASED_OUTPATIENT_CLINIC_OR_DEPARTMENT_OTHER): Payer: Self-pay | Admitting: *Deleted

## 2018-04-30 ENCOUNTER — Ambulatory Visit (HOSPITAL_BASED_OUTPATIENT_CLINIC_OR_DEPARTMENT_OTHER): Payer: Self-pay | Admitting: Anesthesiology

## 2018-04-30 ENCOUNTER — Encounter (HOSPITAL_BASED_OUTPATIENT_CLINIC_OR_DEPARTMENT_OTHER): Payer: Self-pay | Admitting: Anesthesiology

## 2018-04-30 ENCOUNTER — Other Ambulatory Visit: Payer: Self-pay

## 2018-04-30 ENCOUNTER — Ambulatory Visit (HOSPITAL_BASED_OUTPATIENT_CLINIC_OR_DEPARTMENT_OTHER)
Admission: RE | Admit: 2018-04-30 | Discharge: 2018-04-30 | Disposition: A | Discharge status: Home / Self Care | Payer: Self-pay | Source: Ambulatory Visit | Attending: Orthopaedic Surgery | Admitting: Orthopaedic Surgery

## 2018-04-30 ENCOUNTER — Encounter (HOSPITAL_BASED_OUTPATIENT_CLINIC_OR_DEPARTMENT_OTHER)
Admission: RE | Disposition: A | Discharge status: Home / Self Care | Payer: Self-pay | Source: Ambulatory Visit | Attending: Orthopaedic Surgery

## 2018-04-30 DIAGNOSIS — X58XXXA Exposure to other specified factors, initial encounter: Secondary | ICD-10-CM | POA: Insufficient documentation

## 2018-04-30 DIAGNOSIS — S82852A Displaced trimalleolar fracture of left lower leg, initial encounter for closed fracture: Secondary | ICD-10-CM | POA: Insufficient documentation

## 2018-04-30 DIAGNOSIS — Z6832 Body mass index (BMI) 32.0-32.9, adult: Secondary | ICD-10-CM | POA: Insufficient documentation

## 2018-04-30 DIAGNOSIS — Z880 Allergy status to penicillin: Secondary | ICD-10-CM | POA: Insufficient documentation

## 2018-04-30 DIAGNOSIS — S82432A Displaced oblique fracture of shaft of left fibula, initial encounter for closed fracture: Secondary | ICD-10-CM | POA: Insufficient documentation

## 2018-04-30 DIAGNOSIS — F17210 Nicotine dependence, cigarettes, uncomplicated: Secondary | ICD-10-CM | POA: Insufficient documentation

## 2018-04-30 DIAGNOSIS — J45909 Unspecified asthma, uncomplicated: Secondary | ICD-10-CM | POA: Insufficient documentation

## 2018-04-30 DIAGNOSIS — E669 Obesity, unspecified: Secondary | ICD-10-CM | POA: Insufficient documentation

## 2018-04-30 DIAGNOSIS — Z886 Allergy status to analgesic agent status: Secondary | ICD-10-CM | POA: Insufficient documentation

## 2018-04-30 HISTORY — PX: ORIF ANKLE FRACTURE: SHX5408

## 2018-04-30 HISTORY — DX: Displaced trimalleolar fracture of left lower leg, initial encounter for closed fracture: S82.852A

## 2018-04-30 SURGERY — OPEN REDUCTION INTERNAL FIXATION (ORIF) ANKLE FRACTURE
Anesthesia: General | Site: Ankle | Laterality: Left

## 2018-04-30 MED ORDER — HYDROCODONE-ACETAMINOPHEN 7.5-325 MG PO TABS: 1.0000 | ORAL_TABLET | Freq: Once | ORAL | Status: DC | PRN

## 2018-04-30 MED ORDER — BUPIVACAINE-EPINEPHRINE (PF) 0.5% -1:200000 IJ SOLN
INTRAMUSCULAR | Status: DC | PRN
  Administered 2018-04-30: 30 mL via PERINEURAL

## 2018-04-30 MED ORDER — EPHEDRINE SULFATE 50 MG/ML IJ SOLN
INTRAMUSCULAR | Status: DC | PRN
  Administered 2018-04-30: 10 mg via INTRAVENOUS

## 2018-04-30 MED ORDER — FENTANYL CITRATE (PF) 100 MCG/2ML IJ SOLN
INTRAMUSCULAR | Status: AC
Start: 2018-04-30 — End: ?
  Filled 2018-04-30: qty 2

## 2018-04-30 MED ORDER — FENTANYL CITRATE (PF) 100 MCG/2ML IJ SOLN
INTRAMUSCULAR | Status: DC | PRN
  Administered 2018-04-30: 25 ug via INTRAVENOUS
  Administered 2018-04-30: 50 ug via INTRAVENOUS
  Administered 2018-04-30: 25 ug via INTRAVENOUS

## 2018-04-30 MED ORDER — ROPIVACAINE HCL 7.5 MG/ML IJ SOLN
INTRAMUSCULAR | Status: DC | PRN
  Administered 2018-04-30: 20 mL via PERINEURAL

## 2018-04-30 MED ORDER — DEXAMETHASONE SODIUM PHOSPHATE 4 MG/ML IJ SOLN
INTRAMUSCULAR | Status: DC | PRN
  Administered 2018-04-30: 10 mg via INTRAVENOUS

## 2018-04-30 MED ORDER — ONDANSETRON HCL 4 MG/2ML IJ SOLN
INTRAMUSCULAR | Status: DC | PRN
  Administered 2018-04-30: 4 mg via INTRAVENOUS

## 2018-04-30 MED ORDER — VANCOMYCIN HCL 500 MG IV SOLR
INTRAVENOUS | Status: AC
  Filled 2018-04-30: qty 500

## 2018-04-30 MED ORDER — MIDAZOLAM HCL 2 MG/2ML IJ SOLN
INTRAMUSCULAR | Status: AC
  Filled 2018-04-30: qty 2

## 2018-04-30 MED ORDER — MIDAZOLAM HCL 2 MG/2ML IJ SOLN
1.0000 mg | INTRAMUSCULAR | Status: DC | PRN
  Administered 2018-04-30: 2 mg via INTRAVENOUS

## 2018-04-30 MED ORDER — LIDOCAINE HCL (CARDIAC) PF 100 MG/5ML IV SOSY
PREFILLED_SYRINGE | INTRAVENOUS | Status: DC | PRN
  Administered 2018-04-30: 30 mg via INTRAVENOUS

## 2018-04-30 MED ORDER — EPHEDRINE SULFATE 50 MG/ML IJ SOLN
INTRAMUSCULAR | Status: AC
  Filled 2018-04-30: qty 1

## 2018-04-30 MED ORDER — SUCCINYLCHOLINE CHLORIDE 200 MG/10ML IV SOSY
PREFILLED_SYRINGE | INTRAVENOUS | Status: AC
  Filled 2018-04-30: qty 10

## 2018-04-30 MED ORDER — CLINDAMYCIN PHOSPHATE 900 MG/50ML IV SOLN
900.0000 mg | INTRAVENOUS | Status: AC
  Administered 2018-04-30: 900 mg via INTRAVENOUS

## 2018-04-30 MED ORDER — LIDOCAINE HCL (CARDIAC) PF 100 MG/5ML IV SOSY
PREFILLED_SYRINGE | INTRAVENOUS | Status: AC
  Filled 2018-04-30: qty 5

## 2018-04-30 MED ORDER — ONDANSETRON HCL 4 MG/2ML IJ SOLN
INTRAMUSCULAR | Status: AC
  Filled 2018-04-30: qty 2

## 2018-04-30 MED ORDER — ONDANSETRON HCL 4 MG PO TABS: 4.0000 mg | ORAL_TABLET | Freq: Three times a day (TID) | ORAL | 1 refills | Status: AC | PRN

## 2018-04-30 MED ORDER — SODIUM CHLORIDE 0.9 % IJ SOLN
INTRAMUSCULAR | Status: AC
  Filled 2018-04-30: qty 20

## 2018-04-30 MED ORDER — ASPIRIN 81 MG PO TABS: 81.0000 mg | ORAL_TABLET | Freq: Every day | ORAL | 0 refills | Status: AC

## 2018-04-30 MED ORDER — CHLORHEXIDINE GLUCONATE 4 % EX LIQD: 60.0000 mL | Freq: Once | CUTANEOUS | Status: DC

## 2018-04-30 MED ORDER — HYDROMORPHONE HCL 1 MG/ML IJ SOLN: 0.2500 mg | INTRAMUSCULAR | Status: DC | PRN

## 2018-04-30 MED ORDER — PROPOFOL 10 MG/ML IV BOLUS
INTRAVENOUS | Status: DC | PRN
  Administered 2018-04-30: 150 mg via INTRAVENOUS

## 2018-04-30 MED ORDER — 0.9 % SODIUM CHLORIDE (POUR BTL) OPTIME
TOPICAL | Status: DC | PRN
  Administered 2018-04-30: 400 mL

## 2018-04-30 MED ORDER — FENTANYL CITRATE (PF) 100 MCG/2ML IJ SOLN
50.0000 ug | INTRAMUSCULAR | Status: DC | PRN
  Administered 2018-04-30: 100 ug via INTRAVENOUS

## 2018-04-30 MED ORDER — SCOPOLAMINE 1 MG/3DAYS TD PT72: 1.0000 | MEDICATED_PATCH | Freq: Once | TRANSDERMAL | Status: DC | PRN

## 2018-04-30 MED ORDER — MELOXICAM 7.5 MG PO TABS: 7.5000 mg | ORAL_TABLET | Freq: Every day | ORAL | 2 refills | Status: AC

## 2018-04-30 MED ORDER — FENTANYL CITRATE (PF) 100 MCG/2ML IJ SOLN
INTRAMUSCULAR | Status: AC
  Filled 2018-04-30: qty 2

## 2018-04-30 MED ORDER — OXYCODONE HCL 5 MG PO TABS: ORAL_TABLET | ORAL | 0 refills | Status: AC

## 2018-04-30 MED ORDER — METOCLOPRAMIDE HCL 5 MG/ML IJ SOLN: 10.0000 mg | Freq: Once | INTRAMUSCULAR | Status: DC | PRN

## 2018-04-30 MED ORDER — VANCOMYCIN HCL 1000 MG IV SOLR
INTRAVENOUS | Status: AC
  Filled 2018-04-30: qty 1000

## 2018-04-30 MED ORDER — ACETAMINOPHEN 500 MG PO TABS: 1000.0000 mg | ORAL_TABLET | Freq: Three times a day (TID) | ORAL | 0 refills | Status: AC

## 2018-04-30 MED ORDER — LACTATED RINGERS IV SOLN
INTRAVENOUS | Status: DC
  Administered 2018-04-30: 11:00:00 via INTRAVENOUS

## 2018-04-30 MED ORDER — LACTATED RINGERS IV SOLN
INTRAVENOUS | Status: DC | PRN
  Administered 2018-04-30 (×2): via INTRAVENOUS

## 2018-04-30 MED ORDER — PROPOFOL 10 MG/ML IV BOLUS
INTRAVENOUS | Status: AC
  Filled 2018-04-30: qty 20

## 2018-04-30 MED ORDER — VANCOMYCIN HCL 1000 MG IV SOLR
INTRAVENOUS | Status: DC | PRN
  Administered 2018-04-30: 1000 mg via TOPICAL

## 2018-04-30 MED ORDER — MIDAZOLAM HCL 5 MG/5ML IJ SOLN
INTRAMUSCULAR | Status: DC | PRN
  Administered 2018-04-30: 2 mg via INTRAVENOUS

## 2018-04-30 MED ORDER — MEPERIDINE HCL 25 MG/ML IJ SOLN: 6.2500 mg | INTRAMUSCULAR | Status: DC | PRN

## 2018-04-30 MED ORDER — CLINDAMYCIN PHOSPHATE 900 MG/50ML IV SOLN
INTRAVENOUS | Status: AC
  Filled 2018-04-30: qty 50

## 2018-04-30 MED ORDER — DEXAMETHASONE SODIUM PHOSPHATE 10 MG/ML IJ SOLN
INTRAMUSCULAR | Status: AC
  Filled 2018-04-30: qty 1

## 2018-04-30 SURGICAL SUPPLY — 83 items
BANDAGE ACE 4X5 VEL STRL LF (GAUZE/BANDAGES/DRESSINGS) ×3 IMPLANT
BANDAGE ACE 6X5 VEL STRL LF (GAUZE/BANDAGES/DRESSINGS) ×3 IMPLANT
BENZOIN TINCTURE PRP APPL 2/3 (GAUZE/BANDAGES/DRESSINGS) ×6 IMPLANT
BIT DRILL 2 CANN GRADUATED (BIT) ×3 IMPLANT
BIT DRILL 2.5 CANN STRL (BIT) ×3 IMPLANT
BIT DRILL 2.6 CANN (BIT) ×3 IMPLANT
BIT DRILL 2.7 (BIT) ×2
BIT DRILL 2.7X2.7/3XSCR ANKL (BIT) ×1 IMPLANT
BIT DRL 2.7X2.7/3XSCR ANKL (BIT) ×1
BLADE SURG 10 STRL SS (BLADE) ×3 IMPLANT
BLADE SURG 15 STRL LF DISP TIS (BLADE) ×2 IMPLANT
BLADE SURG 15 STRL SS (BLADE) ×4
BNDG COHESIVE 4X5 TAN STRL (GAUZE/BANDAGES/DRESSINGS) ×3 IMPLANT
CHLORAPREP W/TINT 26ML (MISCELLANEOUS) ×6 IMPLANT
CLEANER CAUTERY TIP 5X5 PAD (MISCELLANEOUS) ×1 IMPLANT
CLOSURE WOUND 1/2 X4 (GAUZE/BANDAGES/DRESSINGS) ×1
COVER BACK TABLE 60X90IN (DRAPES) ×3 IMPLANT
CUFF TOURNIQUET SINGLE 24IN (TOURNIQUET CUFF) IMPLANT
CUFF TOURNIQUET SINGLE 34IN LL (TOURNIQUET CUFF) ×3 IMPLANT
DECANTER SPIKE VIAL GLASS SM (MISCELLANEOUS) IMPLANT
DRAPE EXTREMITY T 121X128X90 (DRAPE) ×3 IMPLANT
DRAPE IMP U-DRAPE 54X76 (DRAPES) ×3 IMPLANT
DRAPE OEC MINIVIEW 54X84 (DRAPES) ×3 IMPLANT
DRAPE U-SHAPE 47X51 STRL (DRAPES) ×3 IMPLANT
DRSG PAD ABDOMINAL 8X10 ST (GAUZE/BANDAGES/DRESSINGS) ×9 IMPLANT
ELECT REM PT RETURN 9FT ADLT (ELECTROSURGICAL) ×3
ELECTRODE REM PT RTRN 9FT ADLT (ELECTROSURGICAL) ×1 IMPLANT
GAUZE SPONGE 4X4 12PLY STRL (GAUZE/BANDAGES/DRESSINGS) ×3 IMPLANT
GLOVE BIOGEL PI IND STRL 7.0 (GLOVE) ×3 IMPLANT
GLOVE BIOGEL PI IND STRL 8 (GLOVE) ×1 IMPLANT
GLOVE BIOGEL PI INDICATOR 7.0 (GLOVE) ×6
GLOVE BIOGEL PI INDICATOR 8 (GLOVE) ×2
GLOVE ECLIPSE 6.5 STRL STRAW (GLOVE) ×6 IMPLANT
GLOVE ECLIPSE 8.0 STRL XLNG CF (GLOVE) ×9 IMPLANT
GOWN STRL REUS W/ TWL LRG LVL3 (GOWN DISPOSABLE) ×2 IMPLANT
GOWN STRL REUS W/TWL LRG LVL3 (GOWN DISPOSABLE) ×4
GOWN STRL REUS W/TWL XL LVL3 (GOWN DISPOSABLE) ×3 IMPLANT
GUIDEWIRE 1.35MM (WIRE) ×6 IMPLANT
NEEDLE HYPO 22GX1.5 SAFETY (NEEDLE) IMPLANT
NS IRRIG 1000ML POUR BTL (IV SOLUTION) ×3 IMPLANT
PACK BASIN DAY SURGERY FS (CUSTOM PROCEDURE TRAY) ×3 IMPLANT
PAD CAST 4YDX4 CTTN HI CHSV (CAST SUPPLIES) ×1 IMPLANT
PAD CLEANER CAUTERY TIP 5X5 (MISCELLANEOUS) ×2
PADDING CAST ABS 4INX4YD NS (CAST SUPPLIES)
PADDING CAST ABS COTTON 4X4 ST (CAST SUPPLIES) IMPLANT
PADDING CAST COTTON 4X4 STRL (CAST SUPPLIES) ×2
PADDING CAST COTTON 6X4 STRL (CAST SUPPLIES) ×3 IMPLANT
PENCIL BUTTON HOLSTER BLD 10FT (ELECTRODE) ×3 IMPLANT
PLATE ANKLE 98 10H 1/3 TUBUAL (Plate) ×3 IMPLANT
SCREW CANCELLOUS 4X14 (Screw) ×3 IMPLANT
SCREW CANCELLOUS LP 4.0X18 (Screw) ×6 IMPLANT
SCREW CANN 4.0X46 (Screw) ×3 IMPLANT
SCREW CANN 4.0X50 (Screw) ×2 IMPLANT
SCREW CANN T15 ST 50X4 ST (Screw) ×1 IMPLANT
SCREW LO PRO 2.7X22MM CORTEX (Screw) ×3 IMPLANT
SCREW LOW PROFILE 3.5X14 (Screw) ×3 IMPLANT
SCREW LOW PROFILE 3.5X16 (Screw) ×9 IMPLANT
SCREW LP CANN 4.0X44 (Screw) ×6 IMPLANT
SCREW NLOCK T15 FT 18X3.5XST (Screw) ×1 IMPLANT
SCREW NON LOCK 3.5X18MM (Screw) ×2 IMPLANT
SLEEVE SCD COMPRESS KNEE MED (MISCELLANEOUS) ×3 IMPLANT
SPLINT FAST PLASTER 5X30 (CAST SUPPLIES) ×30
SPLINT PLASTER CAST FAST 5X30 (CAST SUPPLIES) ×15 IMPLANT
SPONGE LAP 18X18 RF (DISPOSABLE) ×3 IMPLANT
STRIP CLOSURE SKIN 1/2X4 (GAUZE/BANDAGES/DRESSINGS) ×2 IMPLANT
SUCTION FRAZIER HANDLE 10FR (MISCELLANEOUS) ×2
SUCTION TUBE FRAZIER 10FR DISP (MISCELLANEOUS) ×1 IMPLANT
SUT MNCRL AB 4-0 PS2 18 (SUTURE) ×6 IMPLANT
SUT MON AB 3-0 SH 27 (SUTURE)
SUT MON AB 3-0 SH27 (SUTURE) IMPLANT
SUT VIC AB 0 CT1 27 (SUTURE) ×2
SUT VIC AB 0 CT1 27XBRD ANBCTR (SUTURE) ×1 IMPLANT
SUT VIC AB 0 SH 27 (SUTURE) ×3 IMPLANT
SUT VIC AB 3-0 SH 27 (SUTURE) ×2
SUT VIC AB 3-0 SH 27X BRD (SUTURE) ×1 IMPLANT
SYR BULB 3OZ (MISCELLANEOUS) ×3 IMPLANT
SYR CONTROL 10ML LL (SYRINGE) IMPLANT
TOWEL GREEN STERILE FF (TOWEL DISPOSABLE) ×6 IMPLANT
TOWEL OR NON WOVEN STRL DISP B (DISPOSABLE) ×3 IMPLANT
TUBE CONNECTING 20'X1/4 (TUBING) ×1
TUBE CONNECTING 20X1/4 (TUBING) ×2 IMPLANT
UNDERPAD 30X30 (UNDERPADS AND DIAPERS) ×3 IMPLANT
YANKAUER SUCT BULB TIP NO VENT (SUCTIONS) ×3 IMPLANT

## 2018-04-30 NOTE — Op Note (Addendum)
Orthopaedic Surgery Operative Note (CSN: [id number])  Shannon Vaughan  September 11, 1976 Date of Surgery: 04/30/2018   Diagnoses:  Left trimalleolar ankle fracture  Procedure: Left tibial weightbearing ORIF with fibula fixation 27828   Operative Finding Successful completion of planned procedure.  Short oblique fracture of fibula with surrounding comminution required bridge plating.    Post-operative plan: The patient will be nwb in splint  The patient will be dc home.  DVT prophylaxis ASA 81mg  qd.  Pain control with PRN pain medication preferring oral medicines.  Follow up plan will be scheduled in approximately 14 days for incision check and XR.  Post-Op Diagnosis: Same Surgeons:Primary: Bjorn Pippin, MD Assistants:None Location: [facility] OR ROOM 6 Anesthesia: Choice Antibiotics: Ancef 2g preop, Vancomycin 1000mg  locally  Tourniquet time: 90 Estimated Blood Loss: minimal Complications: None Specimens: None Implants: * No implants in log *  Indications for Surgery:   Shannon Vaughan is a 42 y.o. female with fracture dislocation of left ankle.  Patient was attempting to see another provider and was unable to get in due to insurance concerns and saw me 3 days later.  Her x-rays in clinic had some slight subluxation of the mortise and we attempted to relocate this in clinic and take tension off her skin.  Skin breakdown, benefits and risks of operative and nonoperative management were discussed prior to surgery with patient/guardian(s) and informed consent form was completed.  Specific risks including infection, need for additional surgery, nonunion, postoperative arthritis   Procedure:   The patient was identified in the preoperative holding area where the surgical site was marked. The patient was taken to the OR where a procedural timeout was called and the above noted anesthesia was induced.  The patient was positioned supine on regular bed.  Preoperative antibiotics were dosed.  The patient's  left ankle was prepped and draped in the usual sterile fashion.  A second preoperative timeout was called.      A tourniquet was used for the above listed time.  We began with our ORIF of the fibula. A longitudinal approach was made along the lateral border of the fibula centered at the fracture site. We dissected down taking care to avoid the superficial peroneal nerve which crossed proximal to our incision. We encountered the fracture site and noted a oblique fracture with proximal medial cortical involvement. The bone quality was reasonable. We are able to reduce it anatomically. We identified that the fracture was amenable to lag screw fixation.  We then placed a 2.7 mm lag screw anterior to posterior.  we then filled 3 holes distal to the fracture site and 4 holes proximal achieving good purchase and fixation with all screws.  We confirmed anatomic reduction of fluoroscopy and then turned our attention to the medial side.  A linear incision was made over the anterior aspect of the medial malleolus were able to identify the fracture site itself. A point the fracture site was cleared of interposed periosteum and we were able to obtain an anatomic reduction was held with point-to-point clamp. At this point we placed 2 partially threaded 45 mm cannulated screws with washers across the fracture site achieving good purchase and good compression with each screw.   At that point we turned our attention back to the lateral side were able to use a joker to help reduce the posterior malleolus fragment.  We then placed a Weber tenaculum on the posterior malleolus through the lateral incision made a small release incision anteriorly and cleared  down to bone with a hemostat to prevent interference of the neurovascular bundle with the clamp.  Able to hold the joint reduced and then placed through the same incision a cannulated screw.  This was done under fluoroscopy and again with good soft tissue and any  neurovascular structures before placing the screw or the drill. We had good purchase with this screw and a good reduction.   The incision was thoroughly irrigated and closed in a multilayer fashion with absorbable sutures after vancomycin powder was placed. A sterile dressing was placed.  A short leg splint was placed. The patient was awoken from general anesthesia and taken to the PACU in stable condition without complication.

## 2018-04-30 NOTE — Progress Notes (Signed)
Assisted Dr. Foster with left, ultrasound guided, popliteal, adductor canal block. Side rails up, monitors on throughout procedure. See vital signs in flow sheet. Tolerated Procedure well. 

## 2018-04-30 NOTE — Anesthesia Procedure Notes (Signed)
Procedure Name: LMA Insertion Date/Time: 04/30/2018 12:19 PM Performed by: Zion DesanctisLinka, Harue Pribble L, CRNA Pre-anesthesia Checklist: Patient identified, Emergency Drugs available, Suction available, Patient being monitored and Timeout performed Patient Re-evaluated:Patient Re-evaluated prior to induction Oxygen Delivery Method: Circle system utilized Preoxygenation: Pre-oxygenation with 100% oxygen Induction Type: IV induction Ventilation: Mask ventilation without difficulty LMA: LMA inserted LMA Size: 4.0 Number of attempts: 1 Airway Equipment and Method: Bite block Placement Confirmation: positive ETCO2 Tube secured with: Tape Dental Injury: Teeth and Oropharynx as per pre-operative assessment

## 2018-04-30 NOTE — Discharge Instructions (Signed)

## 2018-04-30 NOTE — Anesthesia Postprocedure Evaluation (Signed)
Anesthesia Post Note  Patient: Wynelle BeckmannQuanna L Gordin  Procedure(s) Performed: OPEN REDUCTION INTERNAL FIXATION (ORIF) TRIMALLEOLAR  FRACTURE (Left Ankle)     Patient location during evaluation: PACU Anesthesia Type: General Level of consciousness: awake and alert Pain management: pain level controlled Vital Signs Assessment: post-procedure vital signs reviewed and stable Respiratory status: spontaneous breathing, nonlabored ventilation and respiratory function stable Cardiovascular status: blood pressure returned to baseline and stable Postop Assessment: no apparent nausea or vomiting Anesthetic complications: no    Last Vitals:  Vitals:   04/30/18 1500 04/30/18 1515  BP: 134/90 138/89  Pulse: 84 80  Resp: 17 (!) 24  Temp:    SpO2: 100% 97%    Last Pain:  Vitals:   04/30/18 1515  TempSrc:   PainSc: 0-No pain                 Lowella CurbWarren Ray [place] K[place]

## 2018-04-30 NOTE — H&P (Signed)
PREOPERATIVE H&P  Chief Complaint: LEFT ANKLE FRACTURE  HPI: Shannon Vaughan is a 42 y.o. female who presents for preoperative history and physical with a diagnosis of LEFT ANKLE FRACTURE. Symptoms are rated as moderate to severe, and have been worsening.  This is significantly impairing activities of daily living.  Please see my clinic note for full details on this patient's care.  She has elected for surgical management.   We resplinted patient to try and improve mortise till surgery in clinic 2 days ago and take tension off skin.  Past Medical History:  Diagnosis Date  . Asthma   . Eczema   . Trimalleolar fracture of ankle, closed, left, initial encounter    Past Surgical History:  Procedure Laterality Date  . CESAREAN SECTION    . TUBAL LIGATION     Social History   Socioeconomic History  . Marital status: Married    Spouse name: Not on file  . Number of children: Not on file  . Years of education: Not on file  . Highest education level: Not on file  Occupational History  . Not on file  Social Needs  . Financial resource strain: Not on file  . Food insecurity:    Worry: Not on file    Inability: Not on file  . Transportation needs:    Medical: Not on file    Non-medical: Not on file  Tobacco Use  . Smoking status: Current Every Day Smoker    Packs/day: 0.25    Types: Cigarettes  . Smokeless tobacco: Never Used  Substance and Sexual Activity  . Alcohol use: Yes    Comment: 2-3 glasses of wine per week  . Drug use: No  . Sexual activity: Yes    Birth control/protection: Surgical    Comment: BTL  Lifestyle  . Physical activity:    Days per week: Not on file    Minutes per session: Not on file  . Stress: Not on file  Relationships  . Social connections:    Talks on phone: Not on file    Gets together: Not on file    Attends religious service: Not on file    Active member of club or organization: Not on file    Attends meetings of clubs or organizations:  Not on file    Relationship status: Not on file  Other Topics Concern  . Not on file  Social History Narrative  . Not on file   Family History  Problem Relation Age of Onset  . Hypertension Mother   . Asthma Mother   . Asthma Sister   . Asthma Brother   . Hypertension Maternal Grandmother   . Diabetes Maternal Grandmother   . Hypertension Paternal Grandmother   . Diabetes Paternal Grandmother   . Hypertension Maternal Grandfather   . Hypertension Paternal Grandfather   . Other Neg Hx    Allergies  Allergen Reactions  . Penicillins Anaphylaxis and Swelling    Childhood allergy  . Aspirin Other (See Comments)    Causes asthma flares   Prior to Admission medications   Medication Sig Start Date End Date Taking? Authorizing Provider  albuterol (PROVENTIL HFA;VENTOLIN HFA) 108 (90 Base) MCG/ACT inhaler Inhale into the lungs every 6 (six) hours as needed for wheezing or shortness of breath.   Yes [provider]  oxyCODONE-acetaminophen (PERCOCET) 5-325 MG tablet Take 1 tablet by mouth every 6 (six) hours as needed for moderate pain or severe pain. 04/25/18  Yes Fayrene Helperran, [place],  PA-C     Positive ROS: All other systems have been reviewed and were otherwise negative with the exception of those mentioned in the HPI and as above.  Physical Exam: General: Alert, no acute distress Cardiovascular: No pedal edema Respiratory: No cyanosis, no use of accessory musculature GI: No organomegaly, abdomen is soft and non-tender Skin: No lesions in the area of chief complaint Neurologic: Sensation intact distally Psychiatric: Patient is competent for consent with normal mood and affect Lymphatic: No axillary or cervical lymphadenopathy  MUSCULOSKELETAL: L ankle in splint, wiggling toes, skin appropriate on exam 2 days ago.  Assessment: LEFT ANKLE FRACTURE  Plan: Plan for Procedure(s): OPEN REDUCTION INTERNAL FIXATION (ORIF) TRIMALLEOLAR  FRACTURE  The risks benefits and  alternatives were discussed with the patient including but not limited to the risks of nonoperative treatment, versus surgical intervention including infection, bleeding, nerve injury,  blood clots, cardiopulmonary complications, morbidity, mortality, among others, and they were willing to proceed.   Bjorn Pippin, MD  04/30/2018 11:38 AM

## 2018-04-30 NOTE — Anesthesia Procedure Notes (Signed)
Anesthesia Regional Block: Popliteal block   Pre-Anesthetic Checklist: ,, timeout performed, Correct Patient, Correct Site, Correct Laterality, Correct Procedure, Correct Position, site marked, Risks and benefits discussed,  Surgical consent,  Pre-op evaluation,  At surgeon's request and post-op pain management  Laterality: Left  Prep: chloraprep       Needles:  Injection technique: Single-shot  Needle Type: Echogenic Stimulator Needle     Needle Length: 9cm    Needle insertion depth: 8 cm   Additional Needles:   Procedures:,,,, ultrasound used (permanent image in chart),,,,  Narrative:  Start time: 04/30/2018 11:40 AM End time: 04/30/2018 11:45 AM Injection made incrementally with aspirations every 5 mL.  Performed by: Personally  Anesthesiologist: Mal AmabileFoster, Ryann Leavitt, MD  Additional Notes: Timeout performed. Patient sedated. Relevant anatomy ID'd using US. Incremental 2-815ml injection of LA with frequent aspiration. Patient tolerated procedure well.        Left Popliteal Block

## 2018-04-30 NOTE — Anesthesia Preprocedure Evaluation (Addendum)
Anesthesia Evaluation  Patient identified by MRN, date of birth, ID band Patient awake    Reviewed: Allergy & Precautions, NPO status , Patient's Chart, lab work & pertinent test results  Airway Mallampati: II  TM Distance: >3 FB Neck ROM: Full    Dental no notable dental hx. (+) Teeth Intact   Pulmonary asthma , Current Smoker,    Pulmonary exam normal breath sounds clear to auscultation       Cardiovascular negative cardio ROS Normal cardiovascular exam Rhythm:Regular Rate:Normal     Neuro/Psych negative neurological ROS  negative psych ROS   GI/Hepatic negative GI ROS, Neg liver ROS,   Endo/Other  Obesity  Renal/GU negative Renal ROS  negative genitourinary   Musculoskeletal Trimalleolar Fx left ankle Eczema   Abdominal (+) + obese,   Peds  Hematology negative hematology ROS (+)   Anesthesia Other Findings   Reproductive/Obstetrics                            Anesthesia Physical Anesthesia Plan  ASA: II  Anesthesia Plan: General   Post-op Pain Management:  Regional for Post-op pain   Induction: Intravenous  PONV Risk Score and Plan: 3 and Midazolam, Dexamethasone, Ondansetron and Treatment may vary due to age or medical condition  Airway Management Planned: LMA  Additional Equipment:   Intra-op Plan:   Post-operative Plan: Extubation in OR  Informed Consent: I have reviewed the patients History and Physical, chart, labs and discussed the procedure including the risks, benefits and alternatives for the proposed anesthesia with the patient or authorized representative who has indicated his/her understanding and acceptance.   Dental advisory given  Plan Discussed with: Anesthesiologist, CRNA and Surgeon  Anesthesia Plan Comments:        Anesthesia Quick Evaluation

## 2018-04-30 NOTE — Transfer of Care (Signed)
Immediate Anesthesia Transfer of Care Note  Patient: Shannon BeckmannQuanna L Vaughan  Procedure(s) Performed: OPEN REDUCTION INTERNAL FIXATION (ORIF) TRIMALLEOLAR  FRACTURE (Left Ankle)  Patient Location: PACU  Anesthesia Type:General and Regional  Level of Consciousness: awake and sedated  Airway & Oxygen Therapy: Patient Spontanous Breathing and Patient connected to face mask oxygen  Post-op Assessment: Report given to RN and Post -op Vital signs reviewed and stable  Post vital signs: Reviewed and stable  Last Vitals:  Vitals Value Taken Time  BP 129/84 04/30/2018  2:25 PM  Temp    Pulse 76 04/30/2018  2:26 PM  Resp 11 04/30/2018  2:26 PM  SpO2 100 % 04/30/2018  2:26 PM  Vitals shown include unvalidated device data.  Last Pain:  Vitals:   04/30/18 1040  TempSrc: Oral  PainSc: 8       Patients Stated Pain Goal: 3 (04/30/18 1040)  Complications: No apparent anesthesia complications

## 2018-04-30 NOTE — Anesthesia Procedure Notes (Addendum)
Anesthesia Regional Block: Adductor canal block   Pre-Anesthetic Checklist: ,, timeout performed, Correct Patient, Correct Site, Correct Laterality, Correct Procedure, Correct Position, site marked, Risks and benefits discussed,  Surgical consent,  Pre-op evaluation,  At surgeon's request and post-op pain management  Laterality: Left  Prep: chloraprep       Needles:  Injection technique: Single-shot  Needle Type: Echogenic Stimulator Needle     Needle Length: 9cm  Needle Gauge: 21   Needle insertion depth: 89 cm   Additional Needles:   Procedures:,,,, ultrasound used (permanent image in chart),,,,  Narrative:  Start time: 04/30/2018 11:45 AM End time: 04/30/2018 11:47 AM Injection made incrementally with aspirations every 5 mL.  Performed by: Personally  Anesthesiologist: Mal AmabileFoster, Deronte Solis, MD  Additional Notes: Timeout performed. Patient sedated. Relevant anatomy ID'd using US. Incremental 2-295ml injection of LA with frequent aspiration. Patient tolerated procedure well.         Left Adductor Canal Block

## 2018-05-01 ENCOUNTER — Encounter (HOSPITAL_BASED_OUTPATIENT_CLINIC_OR_DEPARTMENT_OTHER): Payer: Self-pay | Admitting: Orthopaedic Surgery

## 2018-09-15 ENCOUNTER — Telehealth: Payer: Self-pay | Admitting: Family Medicine

## 2018-09-15 NOTE — Telephone Encounter (Signed)
Dismissal letter in guarantor snapshot  °

## 2019-01-04 IMAGING — CT CT ANKLE*L* W/O CM
3 series · 12 of 33 positions shown, 14 images · non-contrast
Comparison: Radiographs same date.

CLINICAL DATA: Left ankle fracture/dislocation post closed
reduction.

EXAM:
CT OF THE LEFT ANKLE WITHOUT CONTRAST
TECHNIQUE: Multidetector CT imaging of the left ankle was performed according
to the standard protocol. Multiplanar CT image reconstructions were
also generated.

[Series 4: lfov ext 3.0 b40s · axial · 0.34mm/px · z∈[+285,+414]mm · 4 of 63 slices shown, 5 images]
[im 10/63  soft-tissue]
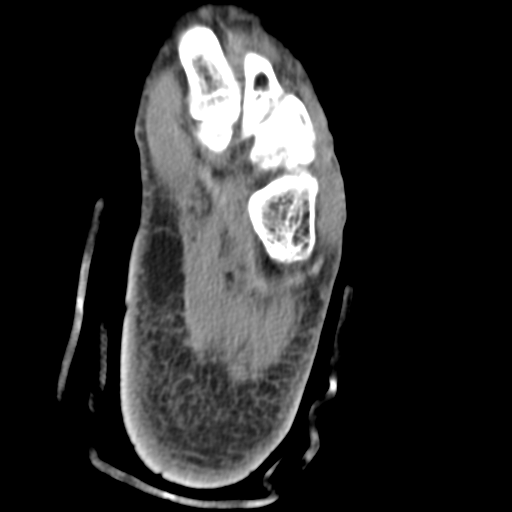
[im 10/63  bone]
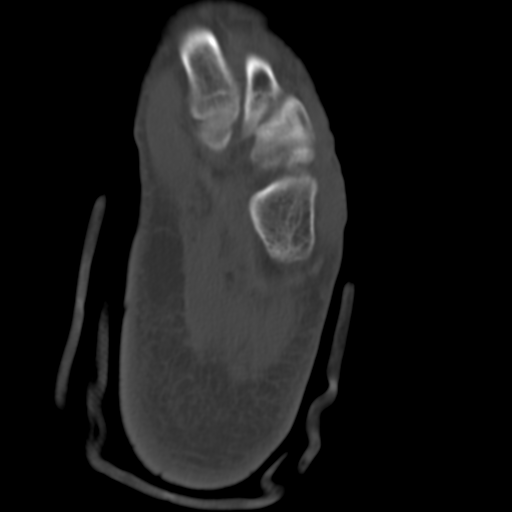
[im 24/63  bone]
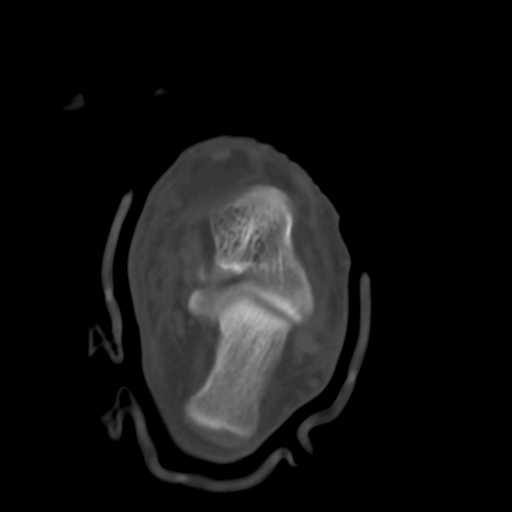
[im 39/63  bone]
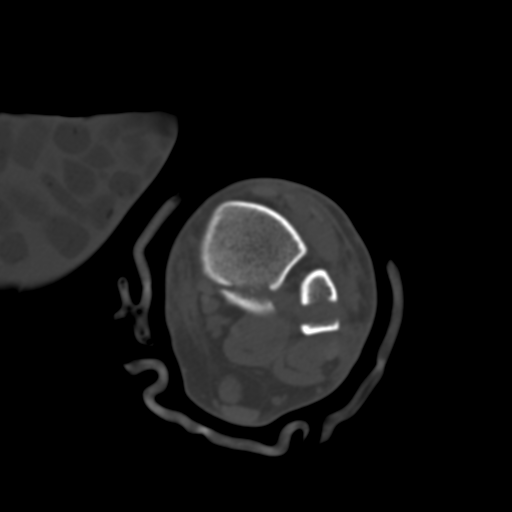
[im 53/63  bone]
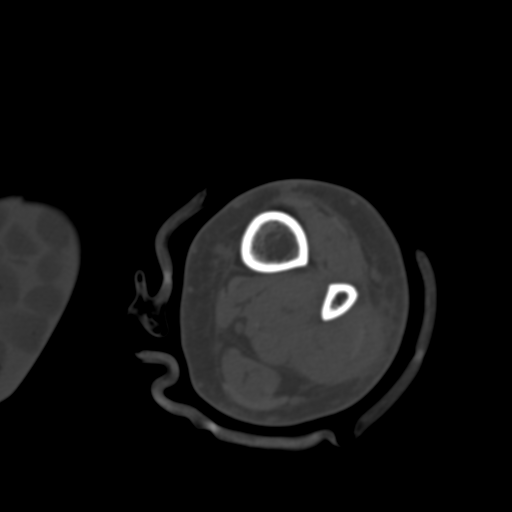

[Series 7: coronalsoft tissue · coronal · 0.34mm/px · 3 of 109 slices shown]
[im 22/109  bone]
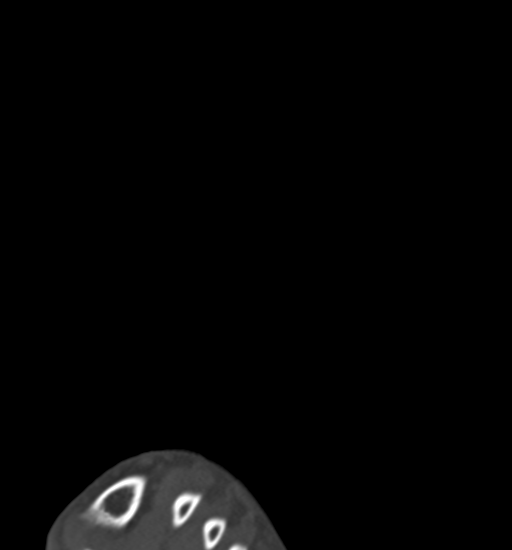
[im 44/109  bone]
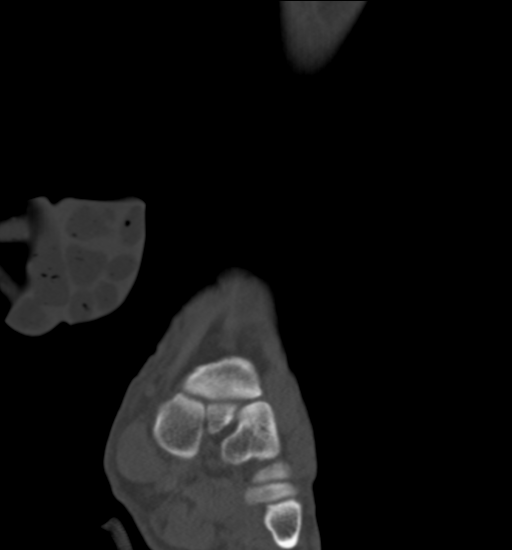
[im 65/109  bone]
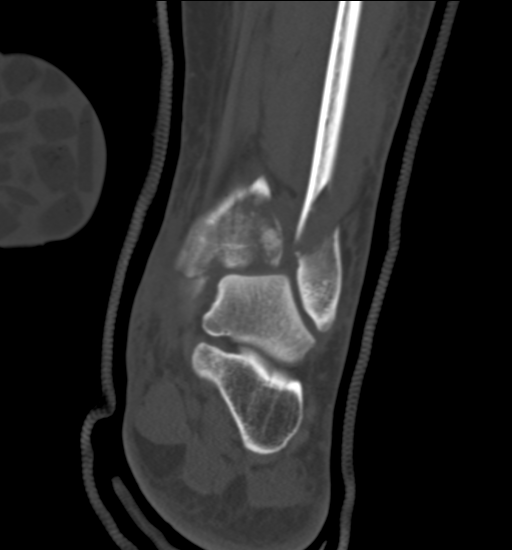

[Series 8: sagittalsoft tissue · sagittal · 0.37mm/px · 5 of 54 slices shown, 6 images]
[im 18/54  bone]
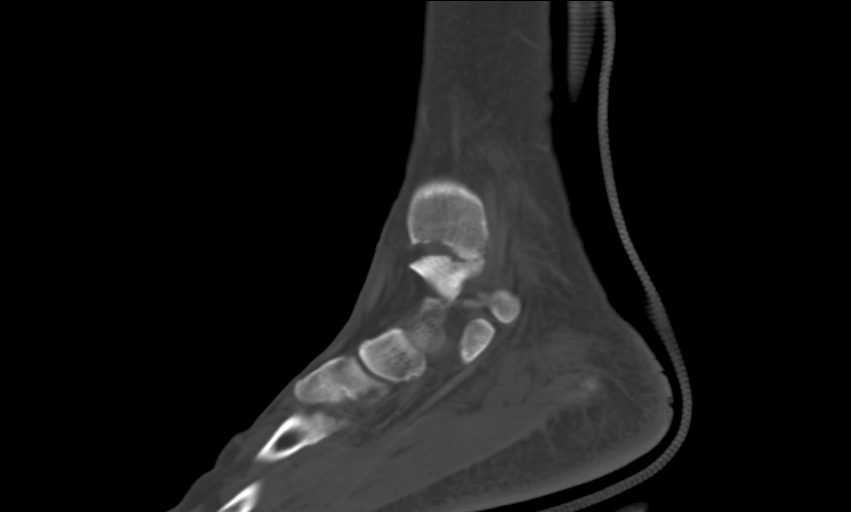
[im 23/54  bone]
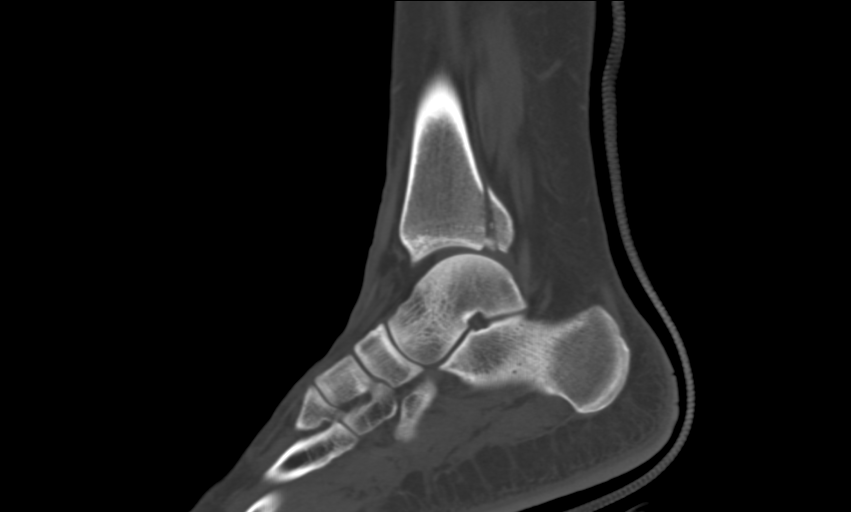
[im 27/54  soft-tissue]
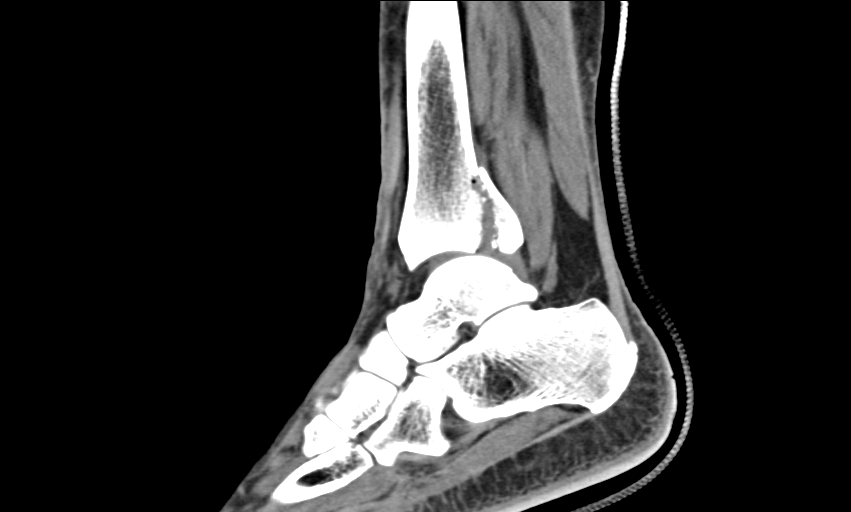
[im 27/54  bone]
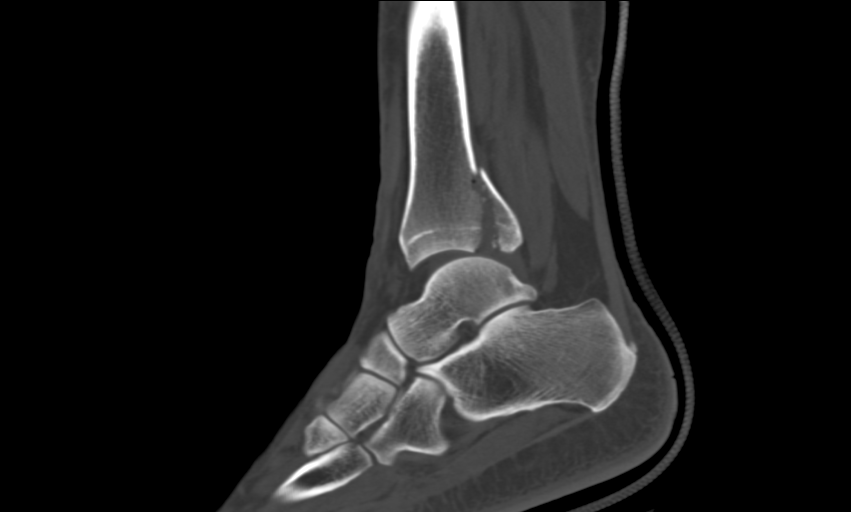
[im 31/54  bone]
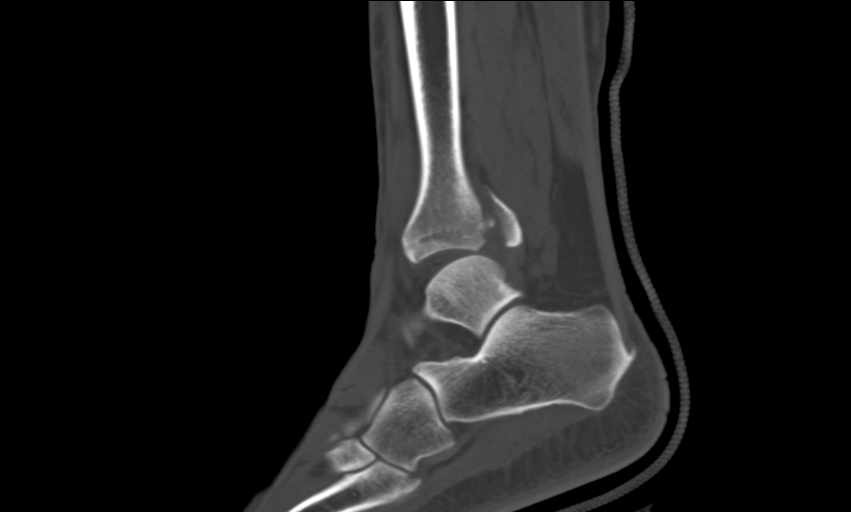
[im 36/54  bone]
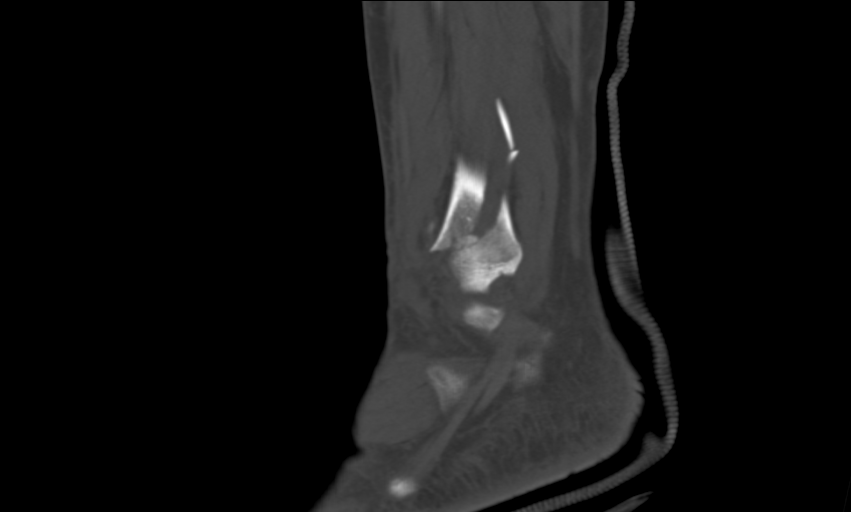

[12 of 33 positions shown; findings below may reference images not displayed]

FINDINGS: Bones/Joint/Cartilage

The ankle is splinted. The transverse fracture through the base the
medial malleolus is mildly comminuted with 8 mm of peripheral
distraction. There is a comminuted, mildly depressed and displaced
fracture of the tibial plafond posteriorly. This results in up to 7
mm distraction along the articular surface laterally (image [DATE]).
There is less than 2 mm of depression of the articular surface.

The oblique fracture of the distal fibular diaphysis demonstrates up
to 7 mm of posterior displacement. There is posterior displacement
of linear fracture fragments which measure up to 3 cm in overall
length. This fractured extends distally into the distal tibiofibular
joint. There is resulting mild widening of the ankle mortise and
mild residual posterior and lateral subluxation of the talus
relative to the tibial plafond.

The talar dome is intact. The subtalar joint and calcaneus are
intact. No tarsal bone fractures are seen. Small type 1 accessory
navicular and os peroneum are noted.

Ligaments

Suboptimally assessed by CT.

Muscles and Tendons

The ankle tendons are grossly intact and normally located. No
entrapment of the medial flexor tendons in the posterior malleolar
fracture identified.

Soft tissues

Mild soft tissue swelling around the ankle. No significant focal
hematoma.
IMPRESSION: 1. Trimalleolar fracture of the left ankle with improved alignment
compared with original radiographs. There is residual displacement
and mild depression of the articular surface of the tibial plafond
posteriorly.
2. Mild residual posterolateral subluxation of the talus relative to
the tibial plafond.
3. No evidence of tarsal bone fracture.

## 2020-04-02 ENCOUNTER — Encounter (INDEPENDENT_AMBULATORY_CARE_PROVIDER_SITE_OTHER): Payer: Self-pay

## 2022-05-22 ENCOUNTER — Encounter (INDEPENDENT_AMBULATORY_CARE_PROVIDER_SITE_OTHER): Payer: Self-pay
